# Patient Record
Sex: Female | Born: 1965 | Hispanic: No | Marital: Married | Smoking: Never smoker
Health system: Southern US, Community
[De-identification: ages and names within clinical notes are randomized; demographics above are authoritative.]

## PROBLEM LIST (undated history)

## (undated) DIAGNOSIS — Z973 Presence of spectacles and contact lenses: Secondary | ICD-10-CM

## (undated) DIAGNOSIS — Z87898 Personal history of other specified conditions: Secondary | ICD-10-CM

## (undated) DIAGNOSIS — Z860100 Personal history of colon polyps, unspecified: Secondary | ICD-10-CM

## (undated) DIAGNOSIS — N879 Dysplasia of cervix uteri, unspecified: Secondary | ICD-10-CM

## (undated) DIAGNOSIS — L709 Acne, unspecified: Secondary | ICD-10-CM

## (undated) DIAGNOSIS — Z862 Personal history of diseases of the blood and blood-forming organs and certain disorders involving the immune mechanism: Secondary | ICD-10-CM

## (undated) DIAGNOSIS — F32A Depression, unspecified: Secondary | ICD-10-CM

## (undated) DIAGNOSIS — K5790 Diverticulosis of intestine, part unspecified, without perforation or abscess without bleeding: Secondary | ICD-10-CM

## (undated) DIAGNOSIS — E039 Hypothyroidism, unspecified: Secondary | ICD-10-CM

## (undated) DIAGNOSIS — R000 Tachycardia, unspecified: Secondary | ICD-10-CM

## (undated) DIAGNOSIS — R112 Nausea with vomiting, unspecified: Secondary | ICD-10-CM

## (undated) DIAGNOSIS — M79603 Pain in arm, unspecified: Secondary | ICD-10-CM

## (undated) DIAGNOSIS — I10 Essential (primary) hypertension: Secondary | ICD-10-CM

## (undated) DIAGNOSIS — Z8619 Personal history of other infectious and parasitic diseases: Secondary | ICD-10-CM

## (undated) DIAGNOSIS — R002 Palpitations: Secondary | ICD-10-CM

## (undated) DIAGNOSIS — K219 Gastro-esophageal reflux disease without esophagitis: Secondary | ICD-10-CM

## (undated) DIAGNOSIS — H269 Unspecified cataract: Secondary | ICD-10-CM

## (undated) DIAGNOSIS — Z8719 Personal history of other diseases of the digestive system: Secondary | ICD-10-CM

## (undated) DIAGNOSIS — F329 Major depressive disorder, single episode, unspecified: Secondary | ICD-10-CM

## (undated) DIAGNOSIS — E079 Disorder of thyroid, unspecified: Secondary | ICD-10-CM

## (undated) DIAGNOSIS — I959 Hypotension, unspecified: Secondary | ICD-10-CM

## (undated) DIAGNOSIS — Z9889 Other specified postprocedural states: Secondary | ICD-10-CM

## (undated) DIAGNOSIS — N3010 Interstitial cystitis (chronic) without hematuria: Secondary | ICD-10-CM

## (undated) DIAGNOSIS — K648 Other hemorrhoids: Secondary | ICD-10-CM

## (undated) DIAGNOSIS — R0989 Other specified symptoms and signs involving the circulatory and respiratory systems: Secondary | ICD-10-CM

## (undated) DIAGNOSIS — K6282 Dysplasia of anus: Secondary | ICD-10-CM

## (undated) HISTORY — DX: Acne, unspecified: L70.9

## (undated) HISTORY — DX: Depression, unspecified: F32.A

## (undated) HISTORY — PX: TONSILLECTOMY: SUR1361

## (undated) HISTORY — PX: SHOULDER SURGERY: SHX246

## (undated) HISTORY — DX: Unspecified cataract: H26.9

## (undated) HISTORY — DX: Hypothyroidism, unspecified: E03.9

## (undated) HISTORY — PX: BREAST ENHANCEMENT SURGERY: SHX7

## (undated) HISTORY — PX: GYNECOLOGIC CRYOSURGERY: SHX857

## (undated) HISTORY — DX: Major depressive disorder, single episode, unspecified: F32.9

## (undated) HISTORY — PX: VAGINAL WOUND CLOSURE / REPAIR: SUR258

## (undated) HISTORY — PX: CHOLECYSTECTOMY: SHX55

## (undated) HISTORY — DX: Disorder of thyroid, unspecified: E07.9

## (undated) HISTORY — PX: WISDOM TOOTH EXTRACTION: SHX21

## (undated) HISTORY — DX: Essential (primary) hypertension: I10

## (undated) HISTORY — PX: CYSTOSCOPY W/ DILATION OF BLADDER: SUR374

## (undated) HISTORY — PX: CERVICAL CONIZATION W/BX: SHX1330

## (undated) HISTORY — DX: Personal history of other specified conditions: Z87.898

## (undated) HISTORY — PX: DILATION AND EVACUATION: SHX1459

## (undated) HISTORY — PX: TONSILECTOMY, ADENOIDECTOMY, BILATERAL MYRINGOTOMY AND TUBES: SHX2538

---

## 1992-05-05 HISTORY — PX: APPENDECTOMY: SHX54

## 1997-07-28 ENCOUNTER — Ambulatory Visit (HOSPITAL_COMMUNITY): Admission: RE | Admit: 1997-07-28 | Discharge: 1997-07-28 | Payer: Self-pay | Admitting: Obstetrics and Gynecology

## 1997-08-03 ENCOUNTER — Other Ambulatory Visit: Admission: RE | Admit: 1997-08-03 | Discharge: 1997-08-03 | Payer: Self-pay | Admitting: Obstetrics and Gynecology

## 1997-08-11 ENCOUNTER — Other Ambulatory Visit: Admission: RE | Admit: 1997-08-11 | Discharge: 1997-08-11 | Payer: Self-pay | Admitting: Obstetrics and Gynecology

## 1997-08-18 ENCOUNTER — Other Ambulatory Visit: Admission: RE | Admit: 1997-08-18 | Discharge: 1997-08-18 | Payer: Self-pay | Admitting: Obstetrics and Gynecology

## 1997-08-25 ENCOUNTER — Other Ambulatory Visit: Admission: RE | Admit: 1997-08-25 | Discharge: 1997-08-25 | Payer: Self-pay | Admitting: Obstetrics and Gynecology

## 1998-04-20 ENCOUNTER — Other Ambulatory Visit: Admission: RE | Admit: 1998-04-20 | Discharge: 1998-04-20 | Payer: Self-pay | Admitting: Obstetrics and Gynecology

## 1998-08-08 ENCOUNTER — Ambulatory Visit (HOSPITAL_COMMUNITY): Admission: RE | Admit: 1998-08-08 | Discharge: 1998-08-08 | Payer: Self-pay | Admitting: Obstetrics and Gynecology

## 1998-09-10 ENCOUNTER — Encounter: Payer: Self-pay | Admitting: Obstetrics and Gynecology

## 1998-09-10 ENCOUNTER — Ambulatory Visit (HOSPITAL_COMMUNITY): Admission: RE | Admit: 1998-09-10 | Discharge: 1998-09-10 | Payer: Self-pay | Admitting: Obstetrics and Gynecology

## 1998-11-23 ENCOUNTER — Other Ambulatory Visit: Admission: RE | Admit: 1998-11-23 | Discharge: 1998-11-23 | Payer: Self-pay | Admitting: Obstetrics and Gynecology

## 1999-06-29 ENCOUNTER — Inpatient Hospital Stay (HOSPITAL_COMMUNITY): Admission: AD | Admit: 1999-06-29 | Discharge: 1999-07-01 | Payer: Self-pay | Admitting: Obstetrics and Gynecology

## 1999-08-05 ENCOUNTER — Other Ambulatory Visit: Admission: RE | Admit: 1999-08-05 | Discharge: 1999-08-05 | Payer: Self-pay | Admitting: Obstetrics and Gynecology

## 2000-07-17 ENCOUNTER — Other Ambulatory Visit: Admission: RE | Admit: 2000-07-17 | Discharge: 2000-07-17 | Payer: Self-pay | Admitting: Obstetrics and Gynecology

## 2000-07-21 ENCOUNTER — Observation Stay (HOSPITAL_COMMUNITY): Admission: AD | Admit: 2000-07-21 | Discharge: 2000-07-21 | Payer: Self-pay | Admitting: Obstetrics and Gynecology

## 2000-11-24 ENCOUNTER — Inpatient Hospital Stay (HOSPITAL_COMMUNITY): Admission: AD | Admit: 2000-11-24 | Discharge: 2000-11-24 | Payer: Self-pay | Admitting: Obstetrics and Gynecology

## 2000-12-05 ENCOUNTER — Inpatient Hospital Stay (HOSPITAL_COMMUNITY): Admission: AD | Admit: 2000-12-05 | Discharge: 2000-12-05 | Payer: Self-pay | Admitting: Obstetrics and Gynecology

## 2001-02-12 ENCOUNTER — Inpatient Hospital Stay (HOSPITAL_COMMUNITY): Admission: AD | Admit: 2001-02-12 | Discharge: 2001-02-12 | Payer: Self-pay | Admitting: Obstetrics and Gynecology

## 2001-02-17 ENCOUNTER — Inpatient Hospital Stay (HOSPITAL_COMMUNITY): Admission: AD | Admit: 2001-02-17 | Discharge: 2001-02-19 | Payer: Self-pay | Admitting: Obstetrics and Gynecology

## 2001-03-25 ENCOUNTER — Other Ambulatory Visit: Admission: RE | Admit: 2001-03-25 | Discharge: 2001-03-25 | Payer: Self-pay | Admitting: Obstetrics and Gynecology

## 2001-09-01 ENCOUNTER — Encounter: Payer: Self-pay | Admitting: Family Medicine

## 2001-09-01 ENCOUNTER — Encounter: Admission: RE | Admit: 2001-09-01 | Discharge: 2001-09-01 | Payer: Self-pay | Admitting: Family Medicine

## 2002-03-28 ENCOUNTER — Other Ambulatory Visit: Admission: RE | Admit: 2002-03-28 | Discharge: 2002-03-28 | Payer: Self-pay | Admitting: Obstetrics and Gynecology

## 2002-04-08 ENCOUNTER — Encounter: Admission: RE | Admit: 2002-04-08 | Discharge: 2002-04-08 | Payer: Self-pay | Admitting: Family Medicine

## 2002-04-08 ENCOUNTER — Encounter: Payer: Self-pay | Admitting: Family Medicine

## 2002-05-03 ENCOUNTER — Encounter: Admission: RE | Admit: 2002-05-03 | Discharge: 2002-05-03 | Payer: Self-pay | Admitting: Family Medicine

## 2002-05-03 ENCOUNTER — Encounter: Payer: Self-pay | Admitting: Family Medicine

## 2003-04-07 ENCOUNTER — Encounter: Admission: RE | Admit: 2003-04-07 | Discharge: 2003-04-07 | Payer: Self-pay | Admitting: Gastroenterology

## 2003-05-04 ENCOUNTER — Other Ambulatory Visit: Admission: RE | Admit: 2003-05-04 | Discharge: 2003-05-04 | Payer: Self-pay | Admitting: Obstetrics and Gynecology

## 2005-03-05 HISTORY — PX: TRANSTHORACIC ECHOCARDIOGRAM: SHX275

## 2005-03-07 ENCOUNTER — Ambulatory Visit: Payer: Self-pay | Admitting: Cardiovascular Disease

## 2005-03-20 ENCOUNTER — Encounter: Payer: Self-pay | Admitting: Cardiology

## 2005-03-20 ENCOUNTER — Ambulatory Visit: Payer: Self-pay

## 2006-10-29 ENCOUNTER — Encounter
Admission: RE | Admit: 2006-10-29 | Discharge: 2006-10-29 | Payer: PRIVATE HEALTH INSURANCE | Admitting: Obstetrics and Gynecology

## 2009-01-10 ENCOUNTER — Emergency Department (HOSPITAL_BASED_OUTPATIENT_CLINIC_OR_DEPARTMENT_OTHER): Admission: EM | Admit: 2009-01-10 | Discharge: 2009-01-10 | Payer: Self-pay | Admitting: Emergency Medicine

## 2009-01-10 ENCOUNTER — Ambulatory Visit: Payer: Self-pay | Admitting: Diagnostic Radiology

## 2010-05-26 ENCOUNTER — Encounter: Payer: Self-pay | Admitting: Internal Medicine

## 2010-08-09 LAB — HEPATIC FUNCTION PANEL
ALT: 30 U/L (ref 0–35)
AST: 28 U/L (ref 0–37)
Albumin: 4 g/dL (ref 3.5–5.2)
Alkaline Phosphatase: 77 U/L (ref 39–117)
Bilirubin, Direct: 0 mg/dL (ref 0.0–0.3)
Indirect Bilirubin: 0.8 mg/dL (ref 0.3–0.9)
Total Bilirubin: 0.8 mg/dL (ref 0.3–1.2)
Total Protein: 7.2 g/dL (ref 6.0–8.3)

## 2010-08-09 LAB — URINE MICROSCOPIC-ADD ON

## 2010-08-09 LAB — CBC
HCT: 39.9 % (ref 36.0–46.0)
Hemoglobin: 14.2 g/dL (ref 12.0–15.0)
MCHC: 35.5 g/dL (ref 30.0–36.0)
MCV: 90.4 fL (ref 78.0–100.0)
Platelets: 362 10*3/uL (ref 150–400)
RBC: 4.42 MIL/uL (ref 3.87–5.11)
RDW: 11.4 % — ABNORMAL LOW (ref 11.5–15.5)
WBC: 7.1 10*3/uL (ref 4.0–10.5)

## 2010-08-09 LAB — DIFFERENTIAL
Basophils Absolute: 0.1 10*3/uL (ref 0.0–0.1)
Basophils Relative: 1 % (ref 0–1)
Eosinophils Absolute: 0.1 10*3/uL (ref 0.0–0.7)
Eosinophils Relative: 1 % (ref 0–5)
Lymphocytes Relative: 29 % (ref 12–46)
Lymphs Abs: 2.1 10*3/uL (ref 0.7–4.0)
Monocytes Absolute: 0.7 10*3/uL (ref 0.1–1.0)
Monocytes Relative: 9 % (ref 3–12)
Neutro Abs: 4.1 10*3/uL (ref 1.7–7.7)
Neutrophils Relative %: 59 % (ref 43–77)

## 2010-08-09 LAB — URINALYSIS, ROUTINE W REFLEX MICROSCOPIC
Bilirubin Urine: NEGATIVE
Glucose, UA: NEGATIVE mg/dL
Hgb urine dipstick: NEGATIVE
Ketones, ur: NEGATIVE mg/dL
Nitrite: NEGATIVE
Protein, ur: NEGATIVE mg/dL
Specific Gravity, Urine: 1.019 (ref 1.005–1.030)
Urobilinogen, UA: 0.2 mg/dL (ref 0.0–1.0)
pH: 8.5 — ABNORMAL HIGH (ref 5.0–8.0)

## 2010-08-09 LAB — BASIC METABOLIC PANEL
BUN: 13 mg/dL (ref 6–23)
CO2: 33 mEq/L — ABNORMAL HIGH (ref 19–32)
Calcium: 10.4 mg/dL (ref 8.4–10.5)
Chloride: 100 mEq/L (ref 96–112)
Creatinine, Ser: 0.6 mg/dL (ref 0.4–1.2)
GFR calc Af Amer: >60 mL/min (ref >60)
GFR calc non Af Amer: >60 mL/min (ref >60)
Glucose, Bld: 87 mg/dL (ref 70–99)
Potassium: 3.5 mEq/L (ref 3.5–5.1)
Sodium: 139 mEq/L (ref 135–145)

## 2010-08-09 LAB — POCT CARDIAC MARKERS
CKMB, poc: <1 ng/mL — ABNORMAL LOW (ref 1.0–8.0)
Myoglobin, poc: 35.4 ng/mL (ref 12–200)
Troponin i, poc: <0.05 ng/mL (ref 0.00–0.09)

## 2010-08-09 LAB — LIPASE, BLOOD: Lipase: 99 U/L (ref 23–300)

## 2010-08-09 LAB — D-DIMER, QUANTITATIVE (NOT AT ARMC): D-Dimer, Quant: <0.22 ug/mL-FEU (ref 0.00–0.48)

## 2010-09-20 NOTE — Op Note (Signed)
[facility]  Patient:    [name], Kimberly Macdonald                    MRN: [id number] Proc. Date: 06/29/99 Adm. Date:  [id number] Attending:  Ardeen Fillers CC:         [facility]                           Operative Report  DELIVERY NOTE  PROCEDURE:                    Outlet vacuum-assisted vaginal delivery.  SURGEON:                      Lenoard Aden, M.D.  ANESTHESIA:                   Epidural.  ESTIMATED BLOOD LOSS:         500 cc.  COMPLICATIONS:                None.  INDICATIONS:                  Maternal exhaustion with request for assistance.  DESCRIPTION OF PROCEDURE:     After being apprised of the risks of operative vaginal delivery, vacuum-assisted delivery, injuries to the infant and maternal  injuries, the patient is prepped and draped.  Bladder catheterized until empty.  After this Mityvac M cup is placed at +4 station from direct OA position for traction x 3 pulls for delivery of a viable female over a second degree episiotomy with third degree extension.  Apgars 8 and 9.  Bulb suction on the perineum. Placenta delivered spontaneously intact.  Three-vessel cord noted.  No cervical or vaginal lacerations noted.  Repair with 0 Monocryl and 0 Vicryl.  EBL about 500 cc. No complications. The patient tolerated the procedure well and is recovering in  good condition. DD:  06/29/99 TD:  06/30/99 Job: [id number]

## 2010-09-20 NOTE — H&P (Signed)
[facility]  Patient:    Kimberly Macdonald, Kimberly Macdonald Visit Number: [id number] MRN: [id number]          Service Type: OBS Location: 910B 9159 01 Attending Physician:  Lenoard Aden Dictated by:   Lenoard Aden, M.D. Admit Date:  02/17/2001   CC:         [facility]   History and Physical  CHIEF COMPLAINT:              Gestational hypertension.  HISTORY OF PRESENT ILLNESS:   The patient is a 45 year old white female, G6, P1, at 37-2/7 weeks with induction for new-onset gestational hypertension with stable laboratory values.  ALLERGIES:                    FLAGYL AND CODEINE.  MEDICATIONS:                  Prenatal vitamins.  OBSTETRICAL HISTORY:          TAB in 1988.  SAB in 1998, 1999, and April 2000. A 7-pound 9-ounce female born in February 2001, without complications. Pregnancy complicated, however, by gestation hypertension, history of postpartum depression.  Placed on Prozac and then Effexor.  FAMILY HISTORY:               Hypertension.  Myocardial infarction.  Heart disease.  Emphysema.  Cervical cancer.  PAST SURGICAL HISTORY:        1. Dilatation for bladder distention treatment                                  for interstitial cystitis.                               2. Shoulder surgery.                               3. Appendectomy.                               4. Tonsillectomy.  PRENATAL LABORATORY DATA:     Blood type B positive, Rh antibody negative. Rubella immune.  Hepatitis B surface antigen negative.  HIV nonreactive. Group B strep performed and was positive.  PHYSICAL EXAMINATION:  GENERAL:                      Well-developed, well-nourished white female in no apparent distress.  HEENT:                        Normal.  LUNGS:                        Clear.  HEART:                        Regular rhythm.  ABDOMEN:                      Soft, gravid, nontender.  Estimated fetal weight of 7-1/2 to 8  pounds.  PELVIC:                       Cervix 2-3 cm, 80%,  vertex, -1.  EXTREMITIES:                  DTRs are 2 to 3+.  No evidence of clonus.  No cords.  NEUROLOGIC:                   Nonfocal.  IMPRESSION:                   1. Thirty-seven-week intrauterine pregnancy.                               2. Group B strep positive.                               3. History of preterm labor.  PLAN:                         Proceed with Pitocin induction.  Watch blood pressures closely.  CBC, [facility] laboratories.  Anticipate attempts at vaginal delivery. Dictated by:   Lenoard Aden, M.D. Attending Physician:  Lenoard Aden DD:  02/17/01 TD:  02/17/01 Job: [id number]

## 2010-09-20 NOTE — Consult Note (Signed)
[facility]  Patient:    Kimberly Macdonald, Kimberly Macdonald                    MRN: [id number] Adm. Date:  [id number] Attending:  Silverio Lay A                          Consultation Report  EMERGENCY ROOM VISIT  REASON FOR CONSULT:           Preterm cervical change.  Here for fetal fibronectin test.  HISTORY OF PRESENT ILLNESS:   This is a 45 year old, married, white female, gravida 2, para 1, at 28+ weeks of pregnancy, who was seen in the office this week by Lenoard Aden, M.D.  At that visit, he performed a fetal fibronectin test, but we got a phone call yesterday from [facility] stating that they could not run the test because the sample had been transported in the 56 degrees median.  All proteins were ______ assured.  The patient was called back and asked to come in the emergency room today to redo that fetal fibronectin test.  She denies any complaints, but reports that she has had cervical shortening over ultrasound and that her last measurement was 2.3 cm.  No contraction or preterm labor signs.  Fetal fibronectin was performed and the patient will be called with the results. DD:  12/05/00 TD:  12/05/00 Job: [id number]

## 2010-09-20 NOTE — Consult Note (Signed)
[facility]  Patient:    Kimberly Macdonald, Kimberly Macdonald Visit Number: [id number] MRN: [id number]          Service Type: Attending:  Lenoard Aden, M.D. Dictated by:   Lenoard Aden, M.D. Consultation Date: 02/12/01 Adm. Date:  02/12/01   CC:         [facility]   Consultation Report  EMERGENCY ROOM CONSULTATION  CHIEF COMPLAINT:              Rule out labor.  HISTORY OF PRESENT ILLNESS:   The patient is a 45 year old white female, G6, P1-0-3-1, EDD March 07, 2001, at 36-5/7 weeks, who presents with increasing contractions this evening.  ALLERGIES:                    FLAGYL, IODINE, CODEINE.  OBSTETRICAL HISTORY:          A TAB in 1988.  SAB in 1998.  SAB in 1999.  SAB April 2000.  Spontaneous vaginal delivery of 7-pound, 9-ounce female in February 2001.  PAST MEDICAL HISTORY:         Postpartum depression, previously on Prozac. Currently on Zoloft.  MEDICATIONS:                  Zoloft and prenatal vitamins.  GYNECOLOGICAL HISTORY:        Laser surgery and cryosurgery due to an abnormal Pap smear.  History of interstitial cystitis, status post hydrodistention.  FAMILY HISTORY:               Myasthenia gravis, emphysema, cervical cancer.  PAST SURGICAL HISTORY:        1. Shoulder surgery.                               2. Bladder distention x 2.                               3. Appendectomy.                               4. Tonsillectomy.  PRENATAL LABORATORY DATA:     Blood type B positive, Rh antibody negative, rubella immune.  Hepatitis B surface antigen negative.  HIV nonreactive.  GC and chlamydia negative.  Pregnancy course complicated by preterm cervical change, stabilized on bed rest.  GBS positive.  PHYSICAL EXAMINATION:  GENERAL:                      Well-developed, well-nourished white female in no apparent distress.  HEENT:                        Normal.  LUNGS:                        Clear.  HEART:                         Regular rhythm.  ABDOMEN:                      Soft, gravid, nontender.  No CVA tenderness noted.  PELVIC:  Cervix is 2 cm dilated, 2 cm long, soft, vertex, and -2.  EXTREMITIES:                  No cords.  NEUROLOGIC:                   Nonfocal.  LABORATORY DATA:              Fetal heart rate tracing reveals the fetal heart rate to be in the 140- to 150-beat-per-minute range, and marked accelerations are noted.  Contractions are irregular, approximately every eight minutes.  IMPRESSION:                   1. Prodromal labor pattern without evidence of                                  cervical change.                               2. At 36+ weeks.                               3. History of preterm cervical change, stable                                  at this time.  Group B strep positive.                               4. History of mildly elevated blood pressure on                                  initial admission.  No signs and symptoms                                  consistent with preeclampsia.  PLAN:                         Discharge to home.  Ambien, pack given.  Ambien prescription 10 mg p.o. q.h.s. p.r.n., #10 given.  Follow up in the office in three to four days.  Preeclamptic warnings given. y Dictated by:   Lenoard Aden, M.D. Attending:  Lenoard Aden, M.D. DD:  02/12/01 TD:  02/12/01 Job: [id number]

## 2010-09-20 NOTE — Op Note (Signed)
[facility]  Patient:    Kimberly Macdonald, Kimberly Macdonald Visit Number: [id number] MRN: [id number]          Service Type: OBS Location: 910A 9115 01 Attending Physician:  Lenoard Aden Dictated by:   Lenoard Aden, M.D. Proc. Date: 02/17/01 Admit Date:  02/17/2001                             Operative Report  PREOPERATIVE DIAGNOSES:       Postpartum bleeding, postoperative cervical laceration at 9, 12, and 3 oclock.  PROCEDURE:                    Repair of cervical laceration x 3.  SURGEON:                      Lenoard Aden, M.D.  ASSISTANT:                    Sung Amabile. Roslyn Smiling, M.D.  ANESTHESIA:                   Spinal by Dr. Harvest Forest.  ESTIMATED BLOOD LOSS:         200 cc.  COMPLICATIONS:                None.  DRAINS:                       None.  COUNTS:                       Correct.  DISPOSITION:                  Patient to recovery in good condition.  OPERATIVE NOTE:               After being apprised of the risks of anesthesia, infection, bleeding, and need to urgently proceed with evaluation of postpartum bleeding, patient was brought to the operating room where she was administered a spinal anesthetic without complications and prepped and draped in the usual sterile fashion, catheterized until the bladder was empty. Weighted speculum placed.  After achieving good anesthesia, Deaver retractors placed and visualization of the cervix using sponge forceps to grasp sequentially each area of the cervix small lacerations are noted at 3 and 9 oclock.  These are closed using an interrupted 0 Chromic suture and at 12 oclock there was an area of abrasion and a horizontal tear along the 12 oclock portion of the cervix and this is sutured using a 0 Chromic matris type suture to achieve good hemostasis.  Suction is accomplished.  Good hemostasis is noted.  All instruments are removed and the area is observed. Cervix is reinspected 360 degrees and  found to be hemostatic.  The patient tolerates procedure well and is transferred to recovery in good condition.Dictated by:   Lenoard Aden, M.D. Attending Physician:  Lenoard Aden DD:  02/17/01 TD:  02/18/01 Job: [id number]

## 2010-09-20 NOTE — H&P (Signed)
[facility]  Patient:    Kimberly Macdonald, Kimberly Macdonald                    MRN: [id number] Adm. Date:  [id number] Attending:  Maxie Better                         History and Physical  24 HOUR OBSERVATION  CHIEF COMPLAINT:              Nausea, vomiting, achiness.  HISTORY OF PRESENT ILLNESS:   The patient is a 45 year old, white female, G6, P1-0-4-1, at [redacted] weeks gestation with persistent nausea, vomiting, ketonuria, and low-grade fever.  PAST MEDICAL HISTORY:         Remarkable for interstitial cystitis and IBS. She has had one complicated vaginal delivery and four uncomplicated miscarriages.  ALLERGIES:                    She has allergies to FLAGYL.  MEDICATIONS:                  Include Effexor and prenatal vitamins.  PAST MEDICAL HISTORY:         In addition, her surgical history includes a tonsillectomy and an appendectomy.  PHYSICAL EXAMINATION:  GENERAL:                      On physical examination she is a well-developed, well-nourished, white female, in a moderate amount of stress.  VITAL SIGNS:                  Temperature 98.1, pulse 88, respirations 20, blood pressure 128/90.  LUNGS:                        Clear.  HEART:                        Regular rate and rhythm.  ABDOMEN:                      Soft, nontender.  PELVIC:                       Examination deferred.  EXTREMITIES:                  Reveal no cords.  NEUROLOGICAL:                 Examination is nonfocal.  IMPRESSION:                   Hyperemesis verus flu-like symptoms.  PLAN:                         Proceed with IV hydration, Zofran IV, given prescription for upper respiratory infection for Ceftin 250 b.i.d. x 7 days and Phenergan with Codeine cough syrup.  Followup in the office p.r.n. DD:  07/21/00 TD:  07/21/00 Job: [id number]

## 2011-04-28 ENCOUNTER — Ambulatory Visit (INDEPENDENT_AMBULATORY_CARE_PROVIDER_SITE_OTHER): Payer: PRIVATE HEALTH INSURANCE

## 2011-04-28 DIAGNOSIS — N760 Acute vaginitis: Secondary | ICD-10-CM

## 2011-04-28 DIAGNOSIS — N898 Other specified noninflammatory disorders of vagina: Secondary | ICD-10-CM

## 2011-05-02 ENCOUNTER — Ambulatory Visit (INDEPENDENT_AMBULATORY_CARE_PROVIDER_SITE_OTHER): Payer: PRIVATE HEALTH INSURANCE

## 2011-05-02 DIAGNOSIS — K645 Perianal venous thrombosis: Secondary | ICD-10-CM

## 2011-05-02 DIAGNOSIS — I10 Essential (primary) hypertension: Secondary | ICD-10-CM

## 2011-05-02 DIAGNOSIS — R002 Palpitations: Secondary | ICD-10-CM

## 2011-05-12 ENCOUNTER — Ambulatory Visit (INDEPENDENT_AMBULATORY_CARE_PROVIDER_SITE_OTHER): Payer: Self-pay

## 2011-05-12 DIAGNOSIS — I959 Hypotension, unspecified: Secondary | ICD-10-CM

## 2011-05-12 DIAGNOSIS — R002 Palpitations: Secondary | ICD-10-CM

## 2011-05-17 ENCOUNTER — Encounter: Payer: Self-pay | Admitting: Family Medicine

## 2011-05-17 DIAGNOSIS — R000 Tachycardia, unspecified: Secondary | ICD-10-CM | POA: Insufficient documentation

## 2011-05-17 DIAGNOSIS — I10 Essential (primary) hypertension: Secondary | ICD-10-CM | POA: Insufficient documentation

## 2011-05-17 DIAGNOSIS — F329 Major depressive disorder, single episode, unspecified: Secondary | ICD-10-CM | POA: Insufficient documentation

## 2011-05-17 DIAGNOSIS — F32A Depression, unspecified: Secondary | ICD-10-CM | POA: Insufficient documentation

## 2011-06-09 ENCOUNTER — Encounter: Payer: Self-pay | Admitting: Family Medicine

## 2011-06-09 ENCOUNTER — Ambulatory Visit: Payer: PRIVATE HEALTH INSURANCE | Admitting: Family Medicine

## 2011-06-09 ENCOUNTER — Ambulatory Visit (INDEPENDENT_AMBULATORY_CARE_PROVIDER_SITE_OTHER): Payer: Self-pay | Admitting: Family Medicine

## 2011-06-09 DIAGNOSIS — R002 Palpitations: Secondary | ICD-10-CM

## 2011-06-09 DIAGNOSIS — Z0000 Encounter for general adult medical examination without abnormal findings: Secondary | ICD-10-CM

## 2011-06-09 DIAGNOSIS — R000 Tachycardia, unspecified: Secondary | ICD-10-CM | POA: Insufficient documentation

## 2011-06-09 DIAGNOSIS — R5381 Other malaise: Secondary | ICD-10-CM

## 2011-06-09 DIAGNOSIS — F411 Generalized anxiety disorder: Secondary | ICD-10-CM

## 2011-06-09 DIAGNOSIS — Z23 Encounter for immunization: Secondary | ICD-10-CM

## 2011-06-09 LAB — COMPREHENSIVE METABOLIC PANEL
ALT: 18 U/L (ref 0–35)
AST: 24 U/L (ref 0–37)
Albumin: 4.2 g/dL (ref 3.5–5.2)
Alkaline Phosphatase: 52 U/L (ref 39–117)
BUN: 11 mg/dL (ref 6–23)
CO2: 30 mEq/L (ref 19–32)
Calcium: 9.7 mg/dL (ref 8.4–10.5)
Chloride: 104 mEq/L (ref 96–112)
Creat: 0.68 mg/dL (ref 0.50–1.10)
Glucose, Bld: 95 mg/dL (ref 70–99)
Potassium: 4.4 mEq/L (ref 3.5–5.3)
Sodium: 142 mEq/L (ref 135–145)
Total Bilirubin: 0.4 mg/dL (ref 0.3–1.2)
Total Protein: 6.3 g/dL (ref 6.0–8.3)

## 2011-06-09 LAB — TSH: TSH: 2.261 u[IU]/mL (ref 0.350–4.500)

## 2011-06-09 LAB — LIPID PANEL
Cholesterol: 192 mg/dL (ref 0–200)
HDL: 61 mg/dL (ref >39)
LDL Cholesterol: 91 mg/dL (ref 0–99)
Total CHOL/HDL Ratio: 3.1 Ratio
Triglycerides: 198 mg/dL — ABNORMAL HIGH (ref <150)
VLDL: 40 mg/dL (ref 0–40)

## 2011-06-09 MED ORDER — TRAZODONE HCL 50 MG PO TABS: 50.0000 mg | ORAL_TABLET | Freq: Every evening | ORAL | Status: DC | PRN

## 2011-06-09 NOTE — Progress Notes (Signed)
  Subjective:    Patient ID: Kimberly Macdonald, female    DOB: 05/31/1965, 46 y.o.   MRN: [id number]  HPI Patient presents for CPE. (Please pink intake form-to be scanned)  Palpitations- currently on Metoprolol. Did forget to take dose yesterday. Has seen [place] cardiologists in the past.   Significant stressors, 16 yo daughter with depression and self mutilating behaviors, 20 year old son with ADD, 27 year old daughter with chronic GI issues.   Insomnia- chronic X years; restoril in the past however benefit short lasting.  Interested in trying another agent.  Review of Systems See pink intake form    Objective:   Physical Exam  Nursing note and vitals reviewed. Constitutional: She is oriented to person, place, and time. She appears well-developed and well-nourished.  HENT:  Head: Normocephalic and atraumatic.  Right Ear: External ear normal.  Left Ear: External ear normal.  Nose: Nose normal.  Eyes: Conjunctivae, EOM and lids are normal. Pupils are equal, round, and reactive to light.  Neck: Normal range of motion. Neck supple.  Cardiovascular: Normal rate, regular rhythm, normal heart sounds and intact distal pulses.   Pulmonary/Chest: Effort normal and breath sounds normal.  Abdominal: Soft. Normal appearance and bowel sounds are normal.  Genitourinary: Vagina normal and uterus normal.       No breast lesions or nipple discharge  Musculoskeletal: Normal range of motion.  Lymphadenopathy:    She has no cervical adenopathy.  Neurological: She is alert and oriented to person, place, and time. She has normal strength and normal reflexes. No cranial nerve deficit.  Skin: Skin is warm and dry.  Psychiatric: She has a normal mood and affect. Cognition and memory are normal.      EKG Sinus tachycardia  See PH-9 and MDQ questionnaires  Assessment & Plan:   1. General medical examination    2. Palpitation  CBC with Differential, Comprehensive metabolic panel, Lipid panel,  TSH, EKG 12-Lead  3. Other malaise and fatigue  Vitamin D 1,25 dihydroxy  4. Anxiety state, unspecified  traZODone (DESYREL) 50 MG tablet qhs, patient to schedule apt for counseling and with psychiatry for medication consult. Names provided

## 2011-06-09 NOTE — Patient Instructions (Signed)
Will call once all lab work is back. Resume Toprol. Lynnae Sandhoff or Marissa Calamity for family counseling Dr. Evelene Croon for medication consult

## 2011-06-10 LAB — CBC WITH DIFFERENTIAL/PLATELET
Basophils Absolute: 0 10*3/uL (ref 0.0–0.1)
Basophils Relative: 0 % (ref 0–1)
Eosinophils Absolute: 0.1 10*3/uL (ref 0.0–0.7)
Eosinophils Relative: 1 % (ref 0–5)
HCT: 39.2 % (ref 36.0–46.0)
Hemoglobin: 13.3 g/dL (ref 12.0–15.0)
Lymphocytes Relative: 32 % (ref 12–46)
Lymphs Abs: 1.7 10*3/uL (ref 0.7–4.0)
MCH: 30.6 pg (ref 26.0–34.0)
MCHC: 33.9 g/dL (ref 30.0–36.0)
MCV: 90.3 fL (ref 78.0–100.0)
Monocytes Absolute: 0.5 10*3/uL (ref 0.1–1.0)
Monocytes Relative: 8 % (ref 3–12)
Neutro Abs: 3.2 10*3/uL (ref 1.7–7.7)
Neutrophils Relative %: 59 % (ref 43–77)
Platelets: 291 10*3/uL (ref 150–400)
RBC: 4.34 MIL/uL (ref 3.87–5.11)
RDW: 12.7 % (ref 11.5–15.5)
WBC: 5.5 10*3/uL (ref 4.0–10.5)

## 2011-06-11 NOTE — Progress Notes (Signed)
Quick Note:  Please call patient with normal labs to date. Some still pending.Thanks! KR  ______

## 2011-06-12 LAB — VITAMIN D 1,25 DIHYDROXY
Vitamin D 1, 25 (OH)2 Total: 113 pg/mL — ABNORMAL HIGH (ref 18–72)
Vitamin D2 1, 25 (OH)2: <8 pg/mL
Vitamin D3 1, 25 (OH)2: 113 pg/mL

## 2011-06-25 ENCOUNTER — Other Ambulatory Visit: Payer: Self-pay

## 2011-06-25 MED ORDER — METOPROLOL SUCCINATE ER 25 MG PO TB24: 50.0000 mg | ORAL_TABLET | Freq: Two times a day (BID) | ORAL | Status: DC

## 2011-07-09 ENCOUNTER — Other Ambulatory Visit: Payer: Self-pay | Admitting: Family Medicine

## 2011-07-28 ENCOUNTER — Other Ambulatory Visit: Payer: Self-pay | Admitting: Family Medicine

## 2011-08-16 ENCOUNTER — Other Ambulatory Visit: Payer: Self-pay | Admitting: *Deleted

## 2011-08-16 MED ORDER — TRAZODONE HCL 50 MG PO TABS: 50.0000 mg | ORAL_TABLET | Freq: Every day | ORAL | Status: DC

## 2011-09-10 ENCOUNTER — Ambulatory Visit (INDEPENDENT_AMBULATORY_CARE_PROVIDER_SITE_OTHER): Payer: Self-pay | Admitting: Emergency Medicine

## 2011-09-10 VITALS — BP 128/85 | HR 74 | Temp 98.6°F | Resp 18 | Ht 62.5 in | Wt 141.0 lb

## 2011-09-10 DIAGNOSIS — E669 Obesity, unspecified: Secondary | ICD-10-CM

## 2011-09-10 DIAGNOSIS — I10 Essential (primary) hypertension: Secondary | ICD-10-CM

## 2011-09-10 NOTE — Progress Notes (Signed)
  Subjective:    Patient ID: Kimberly Macdonald, female    DOB: 03/16/1966, 46 y.o.   MRN: [id number]  HPI patient here concerned about her weight gain. She is requesting phentermine to help with weight loss. She has not been involved in an exercise program recently he has not been adherent to her diet. When I reviewed the chart she's had issues with palpitations in the past and is on blood pressure medication    Review of Systems     Objective:   Physical Exam  Eyes: Pupils are equal, round, and reactive to light.  Neck: No tracheal deviation present. No thyromegaly present.  Cardiovascular: Normal rate and regular rhythm.   Pulmonary/Chest: Effort normal and breath sounds normal. No respiratory distress.  Abdominal: There is no tenderness. There is no rebound and no guarding.          Assessment & Plan:    I told her with her history of palpitations and hypertension she is not a candidate to take that medication. She needs to be aggressive about diet weight loss and exercise.

## 2011-10-04 ENCOUNTER — Other Ambulatory Visit: Payer: Self-pay | Admitting: Physician Assistant

## 2011-10-21 ENCOUNTER — Other Ambulatory Visit: Payer: Self-pay | Admitting: Physician Assistant

## 2011-11-22 ENCOUNTER — Other Ambulatory Visit: Payer: Self-pay | Admitting: Physician Assistant

## 2011-12-03 ENCOUNTER — Telehealth: Payer: Self-pay

## 2011-12-03 NOTE — Telephone Encounter (Signed)
Pt is trying to get her medication refilled- metoprolol because from [place] changing her quanity she has ran out  And is going out of town  Please call [facility] Kimberly Macdonald number [phone]  dfb

## 2011-12-04 MED ORDER — METOPROLOL SUCCINATE ER 25 MG PO TB24: 50.0000 mg | ORAL_TABLET | Freq: Two times a day (BID) | ORAL | Status: DC

## 2011-12-04 NOTE — Telephone Encounter (Signed)
Needs office visit pt states she was advised by Dr Hal Hope she needs 2 pills instead of 1 by phone but we have no record of this. Patient is going to be out of town, states need renewal enough to last until she gets back from out of town. She will be gone until Friday next week, can we call in supply to last until then, she indicated she can follow up after she gets back from out of town but will be difficult for her to come in before she goes on her trip.

## 2011-12-04 NOTE — Telephone Encounter (Signed)
Notified pt that enough of her medication was sent in to cover her until she gets back in town. Pt thanked [place] and agreed to RTC before she runs out.

## 2011-12-04 NOTE — Telephone Encounter (Signed)
Advise patient that a 1-week supply was sent in.  We'll want to see her to adjust the medication-it's an extended release, so we should be able to modify it for her to take 1 pill once daily.

## 2011-12-21 ENCOUNTER — Ambulatory Visit (INDEPENDENT_AMBULATORY_CARE_PROVIDER_SITE_OTHER): Payer: Self-pay | Admitting: Emergency Medicine

## 2011-12-21 VITALS — BP 114/78 | HR 119 | Temp 99.0°F | Resp 17 | Ht 62.0 in | Wt 143.0 lb

## 2011-12-21 DIAGNOSIS — S335XXA Sprain of ligaments of lumbar spine, initial encounter: Secondary | ICD-10-CM

## 2011-12-21 DIAGNOSIS — E039 Hypothyroidism, unspecified: Secondary | ICD-10-CM

## 2011-12-21 DIAGNOSIS — R000 Tachycardia, unspecified: Secondary | ICD-10-CM

## 2011-12-21 MED ORDER — CYCLOBENZAPRINE HCL 10 MG PO TABS: 10.0000 mg | ORAL_TABLET | Freq: Three times a day (TID) | ORAL | Status: AC | PRN

## 2011-12-21 MED ORDER — METOPROLOL SUCCINATE ER 100 MG PO TB24: 100.0000 mg | ORAL_TABLET | Freq: Every day | ORAL | Status: DC

## 2011-12-21 MED ORDER — NAPROXEN SODIUM 550 MG PO TABS: 550.0000 mg | ORAL_TABLET | Freq: Two times a day (BID) | ORAL | Status: DC

## 2011-12-21 MED ORDER — HYDROCODONE-ACETAMINOPHEN 5-325 MG PO TABS: 1.0000 | ORAL_TABLET | Freq: Four times a day (QID) | ORAL | Status: AC | PRN

## 2011-12-21 NOTE — Progress Notes (Signed)
  Subjective:    Patient ID: Kimberly Macdonald, female    DOB: [birth year], 46 y.o.   MRN: [id number]  Back Pain This is a new problem. The current episode started yesterday. The problem occurs constantly. The problem is unchanged. The pain is present in the lumbar spine. The quality of the pain is described as aching. The pain radiates to the left foot. The pain is moderate. The symptoms are aggravated by bending, coughing, position and sitting. Associated symptoms include leg pain. Pertinent negatives include no abdominal pain, bladder incontinence, bowel incontinence, chest pain, dysuria, fever, headaches, numbness, paresis, paresthesias, pelvic pain, perianal numbness, tingling, weakness or weight loss. She has tried NSAIDs for the symptoms.  Palpitations  This is a chronic problem. The current episode started more than 1 year ago. The problem has been unchanged. The symptoms are aggravated by exercise. Pertinent negatives include no anxiety, chest fullness, chest pain, coughing, diaphoresis, dizziness, fever, irregular heartbeat, malaise/fatigue, nausea, near-syncope, numbness, shortness of breath, syncope, vomiting or weakness. She has tried beta blockers for the symptoms. There is no history of anemia, anxiety, drug use, heart disease, hyperthyroidism or a valve disorder.      Review of Systems  Constitutional: Negative.  Negative for fever, weight loss, malaise/fatigue and diaphoresis.  Eyes: Negative.   Respiratory: Negative.  Negative for cough and shortness of breath.   Cardiovascular: Positive for palpitations. Negative for chest pain, syncope and near-syncope.  Gastrointestinal: Negative for nausea, vomiting, abdominal pain and bowel incontinence.  Genitourinary: Negative.  Negative for bladder incontinence, dysuria and pelvic pain.  Musculoskeletal: Positive for back pain.  Neurological: Negative for dizziness, tingling, weakness, numbness, headaches and paresthesias.         Objective:   Physical Exam  Constitutional: She is oriented to person, place, and time. She appears well-developed and well-nourished.  HENT:  Head: Normocephalic and atraumatic.  Right Ear: External ear normal.  Left Ear: External ear normal.  Eyes: Conjunctivae are normal. Pupils are equal, round, and reactive to light.  Neck: Normal range of motion. Neck supple. No thyromegaly present.  Cardiovascular: Regular rhythm.  Tachycardia present.   Pulmonary/Chest: Effort normal and breath sounds normal.  Abdominal: Soft.  Musculoskeletal: She exhibits tenderness (left sacral ).  Neurological: She is alert and oriented to person, place, and time.  Skin: Skin is warm and dry.          Assessment & Plan:  Tachycardia  Plan cardiology consult Hypothyroid  TSH Low back pain Anaprox Flexeril vicodin  Follow up as needed

## 2011-12-21 NOTE — Patient Instructions (Signed)
Back Pain, Adult Low back pain is very common. About 1 in 5 people have back pain.The cause of low back pain is rarely dangerous. The pain often gets better over time.About half of people with a sudden onset of back pain feel better in just 2 weeks. About 8 in 10 people feel better by 6 weeks.  CAUSES Some common causes of back pain include:  Strain of the muscles or ligaments supporting the spine.   Wear and tear (degeneration) of the spinal discs.   Arthritis.   Direct injury to the back.  DIAGNOSIS Most of the time, the direct cause of low back pain is not known.However, back pain can be treated effectively even when the exact cause of the pain is unknown.Answering your caregiver's questions about your overall health and symptoms is one of the most accurate ways to make sure the cause of your pain is not dangerous. If your caregiver needs more information, he or she may order lab work or imaging tests (X-rays or MRIs).However, even if imaging tests show changes in your back, this usually does not require surgery. HOME CARE INSTRUCTIONS For many people, back pain returns.Since low back pain is rarely dangerous, it is often a condition that people can learn to manageon their own.   Remain active. It is stressful on the back to sit or stand in one place. Do not sit, drive, or stand in one place for more than 30 minutes at a time. Take short walks on level surfaces as soon as pain allows.Try to increase the length of time you walk each day.   Do not stay in bed.Resting more than 1 or 2 days can delay your recovery.   Do not avoid exercise or work.Your body is made to move.It is not dangerous to be active, even though your back may hurt.Your back will likely heal faster if you return to being active before your pain is gone.   Pay attention to your body when you bend and lift. Many people have less discomfortwhen lifting if they bend their knees, keep the load close to their  bodies,and avoid twisting. Often, the most comfortable positions are those that put less stress on your recovering back.   Find a comfortable position to sleep. Use a firm mattress and lie on your side with your knees slightly bent. If you lie on your back, put a pillow under your knees.   Only take over-the-counter or prescription medicines as directed by your caregiver. Over-the-counter medicines to reduce pain and inflammation are often the most helpful.Your caregiver may prescribe muscle relaxant drugs.These medicines help dull your pain so you can more quickly return to your normal activities and healthy exercise.   Put ice on the injured area.   Put ice in a plastic bag.   Place a towel between your skin and the bag.   Leave the ice on for 15 to 20 minutes, 3 to 4 times a day for the first 2 to 3 days. After that, ice and heat may be alternated to reduce pain and spasms.   Ask your caregiver about trying back exercises and gentle massage. This may be of some benefit.   Avoid feeling anxious or stressed.Stress increases muscle tension and can worsen back pain.It is important to recognize when you are anxious or stressed and learn ways to manage it.Exercise is a great option.  SEEK MEDICAL CARE IF:  You have pain that is not relieved with rest or medicine.   You have   pain that does not improve in 1 week.   You have new symptoms.   You are generally not feeling well.  SEEK IMMEDIATE MEDICAL CARE IF:   You have pain that radiates from your back into your legs.   You develop new bowel or bladder control problems.   You have unusual weakness or numbness in your arms or legs.   You develop nausea or vomiting.   You develop abdominal pain.   You feel faint.  Document Released: 04/21/2005 Document Revised: 04/10/2011 Document Reviewed: 09/09/2010 [employer] Patient Information 2012 [employer]. 

## 2011-12-22 LAB — TSH: TSH: 2.361 u[IU]/mL (ref 0.350–4.500)

## 2011-12-30 ENCOUNTER — Telehealth: Payer: Self-pay

## 2011-12-30 DIAGNOSIS — R000 Tachycardia, unspecified: Secondary | ICD-10-CM

## 2011-12-30 NOTE — Telephone Encounter (Signed)
PATIENT WANTS A CALL BACK IN REGARDS TO THE REFERRAL SHE HAD AND TO FIND OUT HER RESULTS.  IS WANTING TO KNOW IF SHE COULD SEE A DIFFERENT DOCTOR FOR A REFERRAL.  HER BACK IS NOT ANY BETTER EITHER.  PLEASE CALL BACK AT [phone]

## 2011-12-30 NOTE — Telephone Encounter (Signed)
I have called patient and she needs referral changed, she has been sent to [facility], but she wants changed to [facility] instead this is where her husband goes for cardiac issues.

## 2012-02-03 HISTORY — PX: OTHER SURGICAL HISTORY: SHX169

## 2012-02-05 ENCOUNTER — Other Ambulatory Visit: Payer: Self-pay | Admitting: Physician Assistant

## 2012-02-13 ENCOUNTER — Encounter: Payer: Self-pay | Admitting: Family Medicine

## 2012-05-03 ENCOUNTER — Other Ambulatory Visit: Payer: Self-pay | Admitting: Physician Assistant

## 2012-07-05 ENCOUNTER — Other Ambulatory Visit: Payer: Self-pay | Admitting: Physician Assistant

## 2012-09-03 ENCOUNTER — Telehealth: Payer: Self-pay

## 2012-09-03 MED ORDER — SYNTHROID 50 MCG PO TABS: ORAL_TABLET | ORAL | Status: DC

## 2012-09-03 NOTE — Telephone Encounter (Signed)
Pt states that she is aware that she needs a OV in order to receive a refill on synthroid, but she is down to one pill and she is about to go out of town and wont be able to have an OV until Tuesday. Best# [phone] [facility] [place] ([address])

## 2012-09-22 ENCOUNTER — Ambulatory Visit (INDEPENDENT_AMBULATORY_CARE_PROVIDER_SITE_OTHER): Payer: Self-pay | Admitting: Emergency Medicine

## 2012-09-22 VITALS — BP 122/70 | HR 101 | Temp 98.2°F | Resp 16 | Ht 62.0 in | Wt 132.8 lb

## 2012-09-22 DIAGNOSIS — E039 Hypothyroidism, unspecified: Secondary | ICD-10-CM

## 2012-09-22 DIAGNOSIS — L659 Nonscarring hair loss, unspecified: Secondary | ICD-10-CM

## 2012-09-22 DIAGNOSIS — R109 Unspecified abdominal pain: Secondary | ICD-10-CM

## 2012-09-22 LAB — POCT UA - MICROSCOPIC ONLY
Amorphous: POSITIVE
Casts, Ur, LPF, POC: NEGATIVE
Crystals, Ur, HPF, POC: NEGATIVE
Mucus, UA: NEGATIVE
RBC, urine, microscopic: NEGATIVE
WBC, Ur, HPF, POC: NEGATIVE
Yeast, UA: NEGATIVE

## 2012-09-22 LAB — POCT CBC
Granulocyte percent: 61.4 %G (ref 37–80)
HCT, POC: 42.9 % (ref 37.7–47.9)
Hemoglobin: 13.9 g/dL (ref 12.2–16.2)
Lymph, poc: 2 (ref 0.6–3.4)
MCH, POC: 30.2 pg (ref 27–31.2)
MCHC: 32.4 g/dL (ref 31.8–35.4)
MCV: 93.3 fL (ref 80–97)
MID (cbc): 0.6 (ref 0–0.9)
MPV: 7.6 fL (ref 0–99.8)
POC Granulocyte: 4.1 (ref 2–6.9)
POC LYMPH PERCENT: 30.1 %L (ref 10–50)
POC MID %: 8.5 %M (ref 0–12)
Platelet Count, POC: 375 10*3/uL (ref 142–424)
RBC: 4.6 M/uL (ref 4.04–5.48)
RDW, POC: 13 %
WBC: 6.7 10*3/uL (ref 4.6–10.2)

## 2012-09-22 LAB — COMPREHENSIVE METABOLIC PANEL
ALT: 14 U/L (ref 0–35)
AST: 18 U/L (ref 0–37)
Albumin: 4.4 g/dL (ref 3.5–5.2)
Alkaline Phosphatase: 67 U/L (ref 39–117)
BUN: 14 mg/dL (ref 6–23)
CO2: 25 mEq/L (ref 19–32)
Calcium: 9.7 mg/dL (ref 8.4–10.5)
Chloride: 105 mEq/L (ref 96–112)
Creat: 0.76 mg/dL (ref 0.50–1.10)
Glucose, Bld: 77 mg/dL (ref 70–99)
Potassium: 4 mEq/L (ref 3.5–5.3)
Sodium: 139 mEq/L (ref 135–145)
Total Bilirubin: 0.6 mg/dL (ref 0.3–1.2)
Total Protein: 7.1 g/dL (ref 6.0–8.3)

## 2012-09-22 LAB — POCT URINALYSIS DIPSTICK
Bilirubin, UA: NEGATIVE
Blood, UA: NEGATIVE
Glucose, UA: NEGATIVE
Ketones, UA: NEGATIVE
Leukocytes, UA: NEGATIVE
Nitrite, UA: NEGATIVE
Protein, UA: NEGATIVE
Spec Grav, UA: 1.015
Urobilinogen, UA: 0.2
pH, UA: 8.5

## 2012-09-22 LAB — T4, FREE: Free T4: 1.35 ng/dL (ref 0.80–1.80)

## 2012-09-22 LAB — TSH: TSH: 1.955 u[IU]/mL (ref 0.350–4.500)

## 2012-09-22 MED ORDER — SYNTHROID 50 MCG PO TABS: ORAL_TABLET | ORAL | Status: DC

## 2012-09-22 NOTE — Progress Notes (Signed)
  Subjective:    Patient ID: Kimberly Macdonald, female    DOB: 03/04/1966, 47 y.o.   MRN: [id number]  HPI Patient is here today to get her synthroid refilled.  Patient just ran out yesterday.  Patient also concerned with losing her hair, patient states it started getting really noticeable over the last three months.  Patient noticing gobs of hair on shower floor when she washes it.   Patient also presents with Left sided abd pain, radiating toward pelvic area.   Pain usually has worsening pain in the mornings, denies urinary problems and constipation.  Patient describes it as a toothache in the abd.  Patient denies any worsening symptoms after eating.   Family history of diverticulitis.     Review of Systems     Objective:   Physical Exam        Assessment & Plan:

## 2012-10-11 ENCOUNTER — Ambulatory Visit
Admission: RE | Admit: 2012-10-11 | Discharge: 2012-10-11 | Disposition: A | Discharge status: Home / Self Care | Payer: No Typology Code available for payment source | Source: Ambulatory Visit | Attending: Emergency Medicine | Admitting: Emergency Medicine

## 2012-10-11 DIAGNOSIS — R109 Unspecified abdominal pain: Secondary | ICD-10-CM

## 2012-10-14 ENCOUNTER — Other Ambulatory Visit: Payer: Self-pay | Admitting: Family Medicine

## 2012-10-14 DIAGNOSIS — K8020 Calculus of gallbladder without cholecystitis without obstruction: Secondary | ICD-10-CM

## 2012-10-14 NOTE — Progress Notes (Signed)
Pt seen in office with her son. Is agreeable to referral to [facility] for eval of her gallstones, referral placed. Please close out the [place] results in the lab pool.

## 2012-10-19 ENCOUNTER — Telehealth: Payer: Self-pay

## 2012-10-19 DIAGNOSIS — D1803 Hemangioma of intra-abdominal structures: Secondary | ICD-10-CM

## 2012-10-19 NOTE — Telephone Encounter (Signed)
Pt would like to talk with dr Cleta Alberts about her ultrasound results and mass they found

## 2012-10-20 NOTE — Telephone Encounter (Signed)
Thank you, I called her.  I have put in the order for repeat US she has appt with [facility] [place] tomorrow.

## 2012-10-20 NOTE — Telephone Encounter (Signed)
I called patient and left a detailed message on her answering machine. Be sure a referral has been made [facility] regarding her gallstones. I told the patient that if she still had questions to call back and I would try to answer them. I did tell her we should do another ultrasound of the liver in one year to followup on the hemangioma that was found.

## 2012-10-20 NOTE — Telephone Encounter (Signed)
Please advise. 1. Multiple small gallstones of no more than 1.3 cm in diameter.  No present evidence of acute cholecystitis is seen.  2. Probable 1.2 cm hemangioma in the left lobe of liver.  3. The head and tail of the pancreas are obscured by bowel gas.

## 2012-10-21 ENCOUNTER — Telehealth: Payer: Self-pay

## 2012-10-21 ENCOUNTER — Ambulatory Visit (INDEPENDENT_AMBULATORY_CARE_PROVIDER_SITE_OTHER): Payer: PRIVATE HEALTH INSURANCE | Admitting: General Surgery

## 2012-10-21 ENCOUNTER — Encounter (INDEPENDENT_AMBULATORY_CARE_PROVIDER_SITE_OTHER): Payer: Self-pay | Admitting: General Surgery

## 2012-10-21 ENCOUNTER — Telehealth (INDEPENDENT_AMBULATORY_CARE_PROVIDER_SITE_OTHER): Payer: Self-pay

## 2012-10-21 VITALS — BP 112/82 | HR 88 | Temp 98.4°F | Resp 16 | Ht 62.5 in | Wt 133.4 lb

## 2012-10-21 DIAGNOSIS — K8050 Calculus of bile duct without cholangitis or cholecystitis without obstruction: Secondary | ICD-10-CM

## 2012-10-21 DIAGNOSIS — K8020 Calculus of gallbladder without cholecystitis without obstruction: Secondary | ICD-10-CM

## 2012-10-21 DIAGNOSIS — K8021 Calculus of gallbladder without cholecystitis with obstruction: Secondary | ICD-10-CM

## 2012-10-21 NOTE — Telephone Encounter (Signed)
We have not seen her for her back. She must come in for evaluation. Left message to advise.

## 2012-10-21 NOTE — Telephone Encounter (Signed)
Patient has hurt her back and would a pain medication sent over to her pharmacy. [phone]. Uses [facility] in [place]

## 2012-10-21 NOTE — Progress Notes (Signed)
Patient ID: Kimberly Macdonald, female   DOB: [birth year], 47 y.o.   MRN: [id number]  Chief Complaint  Patient presents with  . Cholelithiasis    new pt    HPI Kimberly Macdonald is a 47 y.o. female.  Patient is a 47 year old female with a history of right upper quadrant pain that radiates to her back. She states that the pain is more consistent in the morning. She does state that prior to relieve her pain to eating. She does concentrate on eating a low fat diet.  HPI  Past Medical History  Diagnosis Date  . Hypertension   . Depression   . Acne   . Thyroid disease     hypothyroidism    Past Surgical History  Procedure Laterality Date  . Appendectomy  1994  . Shoulder surgery    . Tonsilectomy, adenoidectomy, bilateral myringotomy and tubes    . Breast enhancement surgery      Family History  Problem Relation Age of Onset  . Hypertension Mother   . Hyperlipidemia Father   . Depression Sister   . Cancer Maternal Grandmother   . Heart disease Paternal Grandmother   . Heart disease Paternal Grandfather     Social History History  Substance Use Topics  . Smoking status: Never Smoker   . Smokeless tobacco: Not on file  . Alcohol Use: Yes    Allergies  Allergen Reactions  . Azo (Phenazopyridine Hcl) Hives  . Flagyl (Metronidazole) Hives  . Nitrofurantoin Monohyd Macro Rash    Flu like sxs  . Lodine (Etodolac)     "Advil is ok"  . Macrolides And Ketolides     Current Outpatient Prescriptions  Medication Sig Dispense Refill  . b complex vitamins tablet Take 1 tablet by mouth daily.      Marland Kitchen co-enzyme Q-10 30 MG capsule Take 30 mg by mouth 3 (three) times daily.      Marland Kitchen L-Arginine 1000 MG TABS Take 9,000 mg by mouth.      . metoprolol succinate (TOPROL-XL) 100 MG 24 hr tablet Take 1 tablet (100 mg total) by mouth daily.  30 tablet  1  . Multiple Vitamin (MULTIVITAMIN) tablet Take 1 tablet by mouth daily.      . Norgestimate-Ethinyl Estradiol Triphasic (ORTHO TRI-CYCLEN  LO) 0.18/0.215/0.25 MG-25 MCG tablet Take 1 tablet by mouth daily.      Marland Kitchen SYNTHROID 50 MCG tablet take 1 tablet by mouth once daily  30 tablet  11  . valACYclovir (VALTREX) 1000 MG tablet Take 1,000 mg by mouth. Once daily for 5 days       No current facility-administered medications for this visit.    Review of Systems Review of Systems  Constitutional: Negative.   HENT: Negative.   Respiratory: Negative.   Cardiovascular: Negative.   Gastrointestinal: Positive for abdominal pain.  Neurological: Negative.     Blood pressure 112/82, pulse 88, temperature 98.4 F (36.9 C), temperature source Oral, resp. rate 16, height 5' 2.5" (1.588 m), weight 133 lb 6.4 oz (60.51 kg), last menstrual period 09/12/2012.  Physical Exam Physical Exam  Constitutional: She is oriented to person, place, and time. She appears well-developed and well-nourished.  HENT:  Head: Normocephalic and atraumatic.  Eyes: Conjunctivae and EOM are normal. Pupils are equal, round, and reactive to light.  Neck: Normal range of motion. Neck supple.  Cardiovascular: Normal rate, regular rhythm and normal heart sounds.   Pulmonary/Chest: Effort normal and breath sounds normal.  Abdominal: Soft. Bowel sounds  are normal. She exhibits no mass. There is tenderness (RUQ). There is no rebound and no guarding.  Musculoskeletal: Normal range of motion.  Neurological: She is alert and oriented to person, place, and time.    Data Reviewed Ultrasound reveals multiple gallstones in the gallbladder. LFTs within normal limits.  Assessment    Patient is a 47 year old female with biliary colic.     Plan    1. We'll proceed to the operating room for laparoscopic cholecystectomy no intraoperative cholangiogram. 2. All risks and benefits were discussed with the patient to generally include: infection, bleeding, possible need for post op ERCP, damage to the bile ducts, and bile leak. Alternatives were offered and described.  All  questions were answered and the patient voiced understanding of the procedure and wishes to proceed at this point with a laparoscopic cholecystectomy         Marigene Ehlers., [facility] 10/21/2012, 10:38 AM

## 2012-10-21 NOTE — Telephone Encounter (Signed)
Pt called stating she forgot to ask Dr Derrell Lolling for pain med to cover her RUQ abd discomfort until surgery 11-03-12. Pt states advil not really helping. No vomiting. No fever. Voiding well and BM normal. Pt advised I will send request to Dr Derrell Lolling and his assistant to review. Pt request rx be called to [facility] [address] in [place]. Pt aware it may be tomorrow before we can review with MD. Pt states she understands.

## 2012-10-22 NOTE — Telephone Encounter (Signed)
LMOM at 4:23 6/20 to notify patient of pain med being called in...tried the home number but it just rang with no machine to leave a message on

## 2012-10-22 NOTE — Telephone Encounter (Signed)
Called in [place] 5/325 #30          Axel Filler, MD ','<More Detail >>       Axel Filler, MD  Kimberly Macdonald     MRN: [id number] DOB: 1965/07/01     Pt Home: [phone]     Sent: Caleen Essex October 22, 2012 6:31 AM     To: June Leap                          Message    Can you call her in some pain meds?      Thanks   AR                   no [facility] [place] [phone] and spoke with Tammy Sours and pt notified 6/20 @4 :20

## 2012-10-28 ENCOUNTER — Ambulatory Visit (INDEPENDENT_AMBULATORY_CARE_PROVIDER_SITE_OTHER): Payer: Self-pay | Admitting: General Surgery

## 2012-11-03 ENCOUNTER — Other Ambulatory Visit (INDEPENDENT_AMBULATORY_CARE_PROVIDER_SITE_OTHER): Payer: Self-pay | Admitting: General Surgery

## 2012-11-03 DIAGNOSIS — K8010 Calculus of gallbladder with chronic cholecystitis without obstruction: Secondary | ICD-10-CM

## 2012-11-03 DIAGNOSIS — K824 Cholesterolosis of gallbladder: Secondary | ICD-10-CM

## 2013-02-14 ENCOUNTER — Encounter: Payer: Self-pay | Admitting: *Deleted

## 2013-02-15 ENCOUNTER — Ambulatory Visit: Payer: No Typology Code available for payment source | Admitting: Internal Medicine

## 2013-02-15 ENCOUNTER — Encounter: Payer: Self-pay | Admitting: Internal Medicine

## 2013-02-16 ENCOUNTER — Encounter: Payer: Self-pay | Admitting: Internal Medicine

## 2013-02-16 ENCOUNTER — Encounter (HOSPITAL_COMMUNITY): Payer: Self-pay | Admitting: *Deleted

## 2013-02-16 ENCOUNTER — Ambulatory Visit (INDEPENDENT_AMBULATORY_CARE_PROVIDER_SITE_OTHER): Payer: PRIVATE HEALTH INSURANCE | Admitting: Internal Medicine

## 2013-02-16 VITALS — BP 128/80 | HR 86 | Ht 63.0 in | Wt 132.3 lb

## 2013-02-16 DIAGNOSIS — R000 Tachycardia, unspecified: Secondary | ICD-10-CM

## 2013-02-16 DIAGNOSIS — R0789 Other chest pain: Secondary | ICD-10-CM | POA: Insufficient documentation

## 2013-02-16 DIAGNOSIS — I498 Other specified cardiac arrhythmias: Secondary | ICD-10-CM

## 2013-02-16 DIAGNOSIS — R0600 Dyspnea, unspecified: Secondary | ICD-10-CM

## 2013-02-16 DIAGNOSIS — I10 Essential (primary) hypertension: Secondary | ICD-10-CM

## 2013-02-16 DIAGNOSIS — R0609 Other forms of dyspnea: Secondary | ICD-10-CM

## 2013-02-16 DIAGNOSIS — R0989 Other specified symptoms and signs involving the circulatory and respiratory systems: Secondary | ICD-10-CM

## 2013-02-16 NOTE — Progress Notes (Signed)
OFFICE NOTE  Chief Complaint:  Chest heaviness, DOE  Primary Care Physician: Lucilla Edin, MD  HPI:  Kimberly Macdonald  is a pleasant 47 year old female with history of tachy palpitations, as well as low back pain and hypothyroidism. Recently, she has been having increased fatigue, poor sleep sometimes at night, but tachy palpitations and tachycardia. I started her on a beta blocker which helped the symptoms somewhat and I recommended metabolic testing. She underwent a MET test on February 23, 2012, which showed a normal effort with a peak VO2 of 73% predicted. Heart rate, however, was markedly accelerated at 97% predicted and a heart rate on VO2 curve showed a steep incline at anaerobic threshold with some flattening of the VO2 curve consistent with probable small vessel ischemia. While this is considered a low-risk study, it is abnormal and as there is significant tachycardia, I discussed with her that reasonable treatment would be increasing her beta blocker. She does report at nighttime she has some palpitations or flipping of her heart and perhaps she will benefit from more beta blockade. After discussion, I recommended increasing her Toprol XL to 100 mg daily which she takes at night.  She is tolerating this well and does not note any more palpitations. She has been doing more exercise and actually feels better for the most part over the past year.  I have recommended the addition of L-arginine and COQ10 which she is taking.  Despite this, over the past few weeks she has noted a little bit more shortness of breath especially when walking up stairs or going up hills. She has not noted during her regular exercise. This is associated with a little chest heaviness and again has only been present intermittently over the past few weeks. She is concerned because she was feeling so well as to why she is now starting to have these symptoms.  Her chest heaviness is mild, by the way, does not radiate to  the neck or the arm. It would be rated a 2 or 3/10 with regard to pain severity and does improve with rest or avoiding walking up stairs.  PMHx:  Past Medical History  Diagnosis Date  . Hypertension   . Depression   . Acne   . Thyroid disease     hypothyroidism  . History of palpitations   . Hypothyroidism     Past Surgical History  Procedure Laterality Date  . Appendectomy  1994  . Shoulder surgery    . Tonsilectomy, adenoidectomy, bilateral myringotomy and tubes    . Breast enhancement surgery    . Cardiometabolic testing  02/2012    normal effort with peak VO2 of 73% predicted, HR accelerated at 97% predcited, HR on VO2 curve showed steep invline at anaerobic threshold - low risk, abnormal  . Transthoracic echocardiogram  03/2005    EF 55-65%, normal LV systolic function - no LV regional wall motion abnormalities    FAMHx:  Family History  Problem Relation Age of Onset  . Hypertension Mother     diverticulitis  . Hyperlipidemia Father     HTN  . Depression Sister   . Breast cancer Maternal Grandmother   . Heart disease Paternal Grandmother     MI   . Heart disease Paternal Grandfather     MI    SOCHx:   reports that she has never smoked. She has never used smokeless tobacco. She reports that she drinks alcohol. She reports that she does not use illicit  drugs.  ALLERGIES:  Allergies  Allergen Reactions  . Azo [Phenazopyridine Hcl] Hives  . Flagyl [Metronidazole] Hives  . Nitrofurantoin Monohyd Macro Rash    Flu like sxs  . Lodine [Etodolac]     "Advil is ok"  . Macrolides And Ketolides     ROS: A comprehensive review of systems was negative except for: Constitutional: positive for fatigue Respiratory: positive for dyspnea on exertion Cardiovascular: positive for chest pressure/discomfort  HOME MEDS: Current Outpatient Prescriptions  Medication Sig Dispense Refill  . b complex vitamins tablet Take 1 tablet by mouth daily.      Marland Kitchen co-enzyme Q-10 30 MG  capsule Take 300 mg by mouth daily.       Marland Kitchen L-Arginine 1000 MG TABS Take 9,000 mg by mouth.      . metoprolol succinate (TOPROL-XL) 100 MG 24 hr tablet Take 1 tablet (100 mg total) by mouth daily.  30 tablet  1  . Multiple Vitamin (MULTIVITAMIN) tablet Take 1 tablet by mouth daily.      . Norgestimate-Ethinyl Estradiol Triphasic (ORTHO TRI-CYCLEN LO) 0.18/0.215/0.25 MG-25 MCG tablet Take 1 tablet by mouth daily.      Marland Kitchen SYNTHROID 50 MCG tablet take 1 tablet by mouth once daily  30 tablet  11  . valACYclovir (VALTREX) 1000 MG tablet Take 1,000 mg by mouth. Once daily for 5 days when needed       No current facility-administered medications for this visit.    LABS/IMAGING: No results found for this or any previous visit (from the past 48 hour(s)). No results found.  VITALS: BP 128/80  Pulse 86  Ht 5\' 3"  (1.6 m)  Wt 132 lb 4.8 oz (60.011 kg)  BMI 23.44 kg/m2  EXAM: General appearance: alert and no distress Neck: no carotid bruit and no JVD Lungs: clear to auscultation bilaterally Heart: regular rate and rhythm, S1, S2 normal, no murmur, click, rub or gallop Abdomen: soft, non-tender; bowel sounds normal; no masses,  no organomegaly Extremities: extremities normal, atraumatic, no cyanosis or edema Pulses: 2+ and symmetric Skin: Skin color, texture, turgor normal. No rashes or lesions Neurologic: Grossly normal Psych: Pleasant, non-anxious  EKG: Normal sinus rhythm at 86, mild sinus arrhythmia  ASSESSMENT: 1. New-onset chest heaviness/dyspnea and exertion 2. Low-risk metabolic stress test approximately one year ago 3. History of palpitations improved on beta blocker  PLAN: 1.   Kimberly Macdonald is having new chest heaviness and a feeling of shortness of breath especially when walking upstairs. She has done extremely well with a low dose beta blocker, L-arginine and co-Q10. Her med test showed a moderately reduced VO2 without a clear ischemic response. CT would be worthwhile repeating  this study for direct comparison to see if she's had improvement with regards to her VO2 on her current medicines. If that has improved significantly, then her episode currently he is unlikely to be related to a cardiac problem. She does note recently she is perimenopausal and having more heavy periods. I wonder if the anemia could be a factor. I plan to see her back after we repeat her cardiometabolic testing which should occur on her beta blocker. Beta blocker should not be held.  Chrystie Nose, MD, [facility] Attending Cardiologist [facility]  Anjanae Woehrle C 02/16/2013, 1:03 PM

## 2013-02-16 NOTE — Patient Instructions (Signed)
Dr. Rennis Golden would like you to schedule a metabolic stress test. This test is done here in our office on Mondays.  Dr. Rennis Golden would like you to follow up after this test.

## 2013-02-17 ENCOUNTER — Encounter: Payer: Self-pay | Admitting: Internal Medicine

## 2013-02-21 DIAGNOSIS — R0602 Shortness of breath: Secondary | ICD-10-CM

## 2013-03-02 ENCOUNTER — Ambulatory Visit: Payer: PRIVATE HEALTH INSURANCE | Admitting: Internal Medicine

## 2013-03-08 ENCOUNTER — Ambulatory Visit (INDEPENDENT_AMBULATORY_CARE_PROVIDER_SITE_OTHER): Payer: PRIVATE HEALTH INSURANCE | Admitting: Internal Medicine

## 2013-03-08 ENCOUNTER — Encounter: Payer: Self-pay | Admitting: Internal Medicine

## 2013-03-08 VITALS — BP 112/82 | HR 84 | Ht 63.0 in | Wt 135.0 lb

## 2013-03-08 DIAGNOSIS — H8191 Unspecified disorder of vestibular function, right ear: Secondary | ICD-10-CM

## 2013-03-08 DIAGNOSIS — I498 Other specified cardiac arrhythmias: Secondary | ICD-10-CM

## 2013-03-08 DIAGNOSIS — R42 Dizziness and giddiness: Secondary | ICD-10-CM

## 2013-03-08 DIAGNOSIS — R000 Tachycardia, unspecified: Secondary | ICD-10-CM

## 2013-03-08 DIAGNOSIS — R0789 Other chest pain: Secondary | ICD-10-CM

## 2013-03-08 DIAGNOSIS — H8190 Unspecified disorder of vestibular function, unspecified ear: Secondary | ICD-10-CM

## 2013-03-08 DIAGNOSIS — H9190 Unspecified hearing loss, unspecified ear: Secondary | ICD-10-CM

## 2013-03-08 NOTE — Patient Instructions (Addendum)
Your physician recommends that you schedule a follow-up appointment as needed.  You have been referred to Dr. Suszanne Conners for dizziness & hearing loss. Their office should be contacting you.

## 2013-03-08 NOTE — Progress Notes (Signed)
OFFICE NOTE  Chief Complaint:  Chest heaviness, DOE  Primary Care Physician: Lucilla Edin, MD  HPI:  Kimberly Macdonald  is a pleasant 47 year old female with history of tachy palpitations, as well as low back pain and hypothyroidism. Recently, she has been having increased fatigue, poor sleep sometimes at night, but tachy palpitations and tachycardia. I started her on a beta blocker which helped the symptoms somewhat and I recommended metabolic testing. She underwent a MET test on February 23, 2012, which showed a normal effort with a peak VO2 of 73% predicted. Heart rate, however, was markedly accelerated at 97% predicted and a heart rate on VO2 curve showed a steep incline at anaerobic threshold with some flattening of the VO2 curve consistent with probable small vessel ischemia. While this is considered a low-risk study, it is abnormal and as there is significant tachycardia, I discussed with her that reasonable treatment would be increasing her beta blocker. She does report at nighttime she has some palpitations or flipping of her heart and perhaps she will benefit from more beta blockade. After discussion, I recommended increasing her Toprol XL to 100 mg daily which she takes at night.  She is tolerating this well and does not note any more palpitations. She has been doing more exercise and actually feels better for the most part over the past year.  I have recommended the addition of L-arginine and COQ10 which she is taking.  Despite this, over the past few weeks she has noted a little bit more shortness of breath especially when walking up stairs or going up hills. She has not noted during her regular exercise. This is associated with a little chest heaviness and again has only been present intermittently over the past few weeks. She is concerned because she was feeling so well as to why she is now starting to have these symptoms.  Her chest heaviness is mild, by the way, does not radiate to  the neck or the arm. It would be rated a 2 or 3/10 with regard to pain severity and does improve with rest or avoiding walking up stairs.  At her last visit I increased her beta blocker to 50 mg twice daily. Also recommended adding L-arginine and co-Q10 to her regimen. She reports having taken it faithfully and she does think it has helped somewhat with her chest heaviness. She still is getting episodes of dizziness and is struggling with fatigue.  I think that the fatigue may be related to poor sleep. She does go to bed at 10:00 but then does not fall sleep until 12:30 and does not sleep well throughout the night. She previous to taking medications to help with sleep but has not taken any recently. With regards to her shortness of breath she says it somewhat similar to the past. She did have a repeat Cardiolite metabolic test which looks very similar to her prior test except for heart rate was slightly higher. Her peak VO2 has improved slightly to 76% predicted. This is again a low risk study although it showed some mild ischemic dysfunction.  She's also complaining of dizziness and the sensation that her head is pulling to the right. She does have a history of some hearing loss in the past which was diagnosed about 3 years ago.  PMHx:  Past Medical History  Diagnosis Date  . Hypertension   . Depression   . Acne   . Thyroid disease     hypothyroidism  .  History of palpitations   . Hypothyroidism     Past Surgical History  Procedure Laterality Date  . Appendectomy  1994  . Shoulder surgery    . Tonsilectomy, adenoidectomy, bilateral myringotomy and tubes    . Breast enhancement surgery    . Cardiometabolic testing  02/2012    normal effort with peak VO2 of 73% predicted, HR accelerated at 97% predcited, HR on VO2 curve showed steep invline at anaerobic threshold - low risk, abnormal  . Transthoracic echocardiogram  03/2005    EF 55-65%, normal LV systolic function - no LV regional wall  motion abnormalities    FAMHx:  Family History  Problem Relation Age of Onset  . Hypertension Mother     diverticulitis  . Hyperlipidemia Father     HTN  . Depression Sister   . Breast cancer Maternal Grandmother   . Heart disease Paternal Grandmother     MI   . Heart disease Paternal Grandfather     MI    SOCHx:   reports that she has never smoked. She has never used smokeless tobacco. She reports that she drinks alcohol. She reports that she does not use illicit drugs.  ALLERGIES:  Allergies  Allergen Reactions  . Azo [Phenazopyridine Hcl] Hives  . Flagyl [Metronidazole] Hives  . Nitrofurantoin Monohyd Macro Rash    Flu like sxs  . Lodine [Etodolac]     "Advil is ok"  . Macrolides And Ketolides     ROS: A comprehensive review of systems was negative except for: Constitutional: positive for fatigue Ears, nose, mouth, throat, and face: positive for hearing loss Respiratory: positive for dyspnea on exertion Cardiovascular: positive for chest pressure/discomfort Neurological: positive for dizziness  HOME MEDS: Current Outpatient Prescriptions  Medication Sig Dispense Refill  . b complex vitamins tablet Take 1 tablet by mouth daily.      Marland Kitchen co-enzyme Q-10 30 MG capsule Take 300 mg by mouth daily.       Marland Kitchen L-Arginine 1000 MG TABS Take 9,000 mg by mouth.      . metoprolol succinate (TOPROL-XL) 100 MG 24 hr tablet Take 1 tablet (100 mg total) by mouth daily.  30 tablet  1  . Multiple Vitamin (MULTIVITAMIN) tablet Take 1 tablet by mouth daily.      . Norgestimate-Ethinyl Estradiol Triphasic (ORTHO TRI-CYCLEN LO) 0.18/0.215/0.25 MG-25 MCG tablet Take 1 tablet by mouth daily.      Marland Kitchen SYNTHROID 50 MCG tablet take 1 tablet by mouth once daily  30 tablet  11  . valACYclovir (VALTREX) 1000 MG tablet Take 1,000 mg by mouth. Once daily for 5 days when needed       No current facility-administered medications for this visit.    LABS/IMAGING: No results found for this or any  previous visit (from the past 48 hour(s)). No results found.  VITALS: BP 112/82  Pulse 84  Ht 5\' 3"  (1.6 m)  Wt 135 lb (61.236 kg)  BMI 23.92 kg/m2  EXAM: General appearance: alert and no distress Neck: no carotid bruit and no JVD Lungs: clear to auscultation bilaterally Heart: regular rate and rhythm, S1, S2 normal, no murmur, click, rub or gallop Abdomen: soft, non-tender; bowel sounds normal; no masses,  no organomegaly Extremities: extremities normal, atraumatic, no cyanosis or edema Pulses: 2+ and symmetric Skin: Skin color, texture, turgor normal. No rashes or lesions Neurologic: Grossly normal Psych: Pleasant, non-anxious  EKG: deferred  ASSESSMENT: 1. Mildly abnormal metabolic testing, slightly improved with increased dose of beta  blocker and supplements 2. History of palpitations improved on beta blocker 3. Fatigue, possibly due to poor sleep 4. Dizziness and associated hearing loss  PLAN: 1.   Mrs. Arias reports mild if any improvement in her symptoms. Her metabolic testing is similar if not slightly improved from her last study. I do not have a good sense as to what is causing her shortness of breath and fatigue. I think sleep is a big factor in it she should consider something to help her sleep or further workup through her primary doctor. With regards to her dizziness and associated hearing loss. She seems to have a dizziness which pulls her head to one side. She denies any sinus congestion or headache associated with this. She does report that she has some hearing loss and was tested about 3 years ago but not treated. I wonder if there could be a neuro-vestibular problem that is causing her dizziness and possibly associated hearing loss. I will glad refer her to ENT.  Chrystie Nose, MD, [facility] Attending Cardiologist [facility]  Bird Swetz C 03/08/2013, 6:16 PM

## 2013-03-09 ENCOUNTER — Telehealth: Payer: Self-pay | Admitting: Internal Medicine

## 2013-03-09 NOTE — Telephone Encounter (Signed)
Faxed office note and demographics to [facility] requesting appt for her dizziness and hearing loss.  (I was unable to reach the practice by phone due to the long wait time on the phone)

## 2013-03-10 NOTE — Telephone Encounter (Signed)
SW WITH PATIENT TO LET HER KNOW APPT SCHEDULED WITH DR. Annalee Genta ON 03/14/13. PATIENT AGREED TO THE TIME AND DATE.

## 2013-04-15 ENCOUNTER — Other Ambulatory Visit: Payer: Self-pay | Admitting: Internal Medicine

## 2013-05-03 ENCOUNTER — Ambulatory Visit (INDEPENDENT_AMBULATORY_CARE_PROVIDER_SITE_OTHER): Payer: Self-pay | Admitting: Emergency Medicine

## 2013-05-03 VITALS — BP 122/70 | HR 100 | Temp 98.8°F | Resp 16 | Ht 63.0 in | Wt 141.0 lb

## 2013-05-03 DIAGNOSIS — E039 Hypothyroidism, unspecified: Secondary | ICD-10-CM

## 2013-05-03 DIAGNOSIS — R000 Tachycardia, unspecified: Secondary | ICD-10-CM

## 2013-05-03 MED ORDER — SYNTHROID 50 MCG PO TABS: ORAL_TABLET | ORAL | Status: DC

## 2013-05-03 MED ORDER — METOPROLOL SUCCINATE ER 100 MG PO TB24: 100.0000 mg | ORAL_TABLET | Freq: Every day | ORAL | Status: DC

## 2013-05-03 NOTE — Patient Instructions (Signed)

## 2013-05-03 NOTE — Progress Notes (Signed)
[facility] [address], [place] [address] [phone]  Date:  05/03/2013   Name:  Kimberly Macdonald   DOB:  05-Dec-1965   MRN:  [id number]  PCP:  Lucilla Edin, MD    Chief Complaint: rx refills   History of Present Illness:  Kimberly Macdonald is a 47 y.o. very pleasant female patient who presents with the following:  History of tachycardia and acquired hypothyroidism.  Is nearly out of her toprol xl.   Tolerating the dose with no adverse effect of medication other than some fatigue and a referral for dizziness to ENT.  Last labs in May.  Denies other complaint or health concern today.   Patient Active Problem List   Diagnosis Date Noted  . Vestibular dizziness 03/08/2013  . Hearing loss 03/08/2013  . Chest heaviness 02/16/2013  . Sinus tachycardia 06/09/2011  . HTN (hypertension), benign 05/17/2011  . Tachycardia 05/17/2011  . Depression 05/17/2011    Past Medical History  Diagnosis Date  . Hypertension   . Depression   . Acne   . Thyroid disease     hypothyroidism  . History of palpitations   . Hypothyroidism     Past Surgical History  Procedure Laterality Date  . Appendectomy  1994  . Shoulder surgery    . Tonsilectomy, adenoidectomy, bilateral myringotomy and tubes    . Breast enhancement surgery    . Cardiometabolic testing  02/2012    normal effort with peak VO2 of 73% predicted, HR accelerated at 97% predcited, HR on VO2 curve showed steep invline at anaerobic threshold - low risk, abnormal  . Transthoracic echocardiogram  03/2005    EF 55-65%, normal LV systolic function - no LV regional wall motion abnormalities    History  Substance Use Topics  . Smoking status: Never Smoker   . Smokeless tobacco: Never Used  . Alcohol Use: Yes     Comment: occasionally (twice yearly)    Family History  Problem Relation Age of Onset  . Hypertension Mother     diverticulitis  . Hyperlipidemia Father     HTN  . Depression Sister    . Breast cancer Maternal Grandmother   . Heart disease Paternal Grandmother     MI   . Heart disease Paternal Grandfather     MI    Allergies  Allergen Reactions  . Azo [Phenazopyridine Hcl] Hives  . Flagyl [Metronidazole] Hives  . Nitrofurantoin Monohyd Macro Rash    Flu like sxs  . Lodine [Etodolac]     "Advil is ok"  . Macrolides And Ketolides     Medication list has been reviewed and updated.  Current Outpatient Prescriptions on File Prior to Visit  Medication Sig Dispense Refill  . b complex vitamins tablet Take 1 tablet by mouth daily.      Marland Kitchen co-enzyme Q-10 30 MG capsule Take 300 mg by mouth daily.       Marland Kitchen L-Arginine 1000 MG TABS Take 9,000 mg by mouth.      . metoprolol succinate (TOPROL-XL) 100 MG 24 hr tablet Take 1 tablet (100 mg total) by mouth daily.  30 tablet  1  . Multiple Vitamin (MULTIVITAMIN) tablet Take 1 tablet by mouth daily.      . Norgestimate-Ethinyl Estradiol Triphasic (ORTHO TRI-CYCLEN LO) 0.18/0.215/0.25 MG-25 MCG tablet Take 1 tablet by mouth daily.      Marland Kitchen SYNTHROID 50 MCG tablet take 1 tablet by mouth once daily  30 tablet  11  . valACYclovir (VALTREX) 1000 MG tablet Take 1,000 mg by mouth. Once daily for 5 days when needed       No current facility-administered medications on file prior to visit.    Review of Systems:  As per HPI, otherwise negative.    Physical Examination: Filed Vitals:   05/03/13 1538  BP: 122/70  Pulse: 100  Temp: 98.8 F (37.1 C)  Resp: 16   Filed Vitals:   05/03/13 1538  Height: 5\' 3"  (1.6 m)  Weight: 141 lb (63.957 kg)   Body mass index is 24.98 kg/(m^2). Ideal Body Weight: Weight in (lb) to have BMI = 25: 140.8  GEN: WDWN, NAD, Non-toxic, A & O x 3 HEENT: Atraumatic, Normocephalic. Neck supple. No masses, No LAD. Ears and Nose: No external deformity. CV: RRR, No M/G/R. No JVD. No thrill. No extra heart sounds.  RST PULM: CTA B, no wheezes, crackles, rhonchi. No retractions. No resp. distress. No  accessory muscle use. ABD: S, NT, ND, +BS. No rebound. No HSM. EXTR: No c/c/e NEURO Normal gait.  PSYCH: Normally interactive. Conversant. Not depressed or anxious appearing.  Calm demeanor.    Assessment and Plan: Sinus tachycardia Hypothyroid Refills  Signed,  Phillips Odor, MD

## 2013-08-09 ENCOUNTER — Ambulatory Visit: Payer: Self-pay | Admitting: Internal Medicine

## 2013-08-09 VITALS — BP 112/90 | HR 89 | Temp 98.1°F | Resp 18 | Wt 133.0 lb

## 2013-08-09 DIAGNOSIS — M79646 Pain in unspecified finger(s): Secondary | ICD-10-CM

## 2013-08-09 DIAGNOSIS — M79609 Pain in unspecified limb: Secondary | ICD-10-CM

## 2013-08-09 DIAGNOSIS — IMO0002 Reserved for concepts with insufficient information to code with codable children: Secondary | ICD-10-CM

## 2013-08-09 MED ORDER — HYDROCODONE-ACETAMINOPHEN 5-325 MG PO TABS: 1.0000 | ORAL_TABLET | Freq: Four times a day (QID) | ORAL | Status: DC | PRN

## 2013-08-09 MED ORDER — DOXYCYCLINE HYCLATE 100 MG PO TABS: 100.0000 mg | ORAL_TABLET | Freq: Two times a day (BID) | ORAL | Status: DC

## 2013-08-09 NOTE — Progress Notes (Signed)
   Subjective:    Patient ID: Kimberly Macdonald, female    DOB: [birth year], 48 y.o.   MRN: [id number]  HPI  48 y/o female left middle finger x 1 month swollen, painful, intermittent throbbing, had acrylic nails, took them off 1 month ago, she states when the nails were applied there was a bit of pain, the pain sometimes radiated up her left arm.  Has early redness to eponychium and to volar finger pad. Review of Systems     Objective:   Physical Exam  Constitutional: She appears well-developed and well-nourished. No distress.  HENT:  Head: Normocephalic.  Eyes: EOM are normal. Pupils are equal, round, and reactive to light.  Neck: Normal range of motion.  Pulmonary/Chest: Effort normal.  Musculoskeletal: She exhibits edema and tenderness.       Right shoulder: She exhibits tenderness. She exhibits normal range of motion.       Right hand: She exhibits tenderness. She exhibits no bony tenderness. Normal sensation noted. Normal strength noted.  Middle finger at nail red, swollen mildly          Assessment & Plan:  Paronychia/Pain finger Doxycycline/Vicodin

## 2013-08-09 NOTE — Patient Instructions (Signed)
Paronychia Paronychia is an inflammatory reaction involving the folds of the skin surrounding the fingernail. This is commonly caused by an infection in the skin around a nail. The most common cause of paronychia is frequent wetting of the hands (as seen with bartenders, food servers, nurses or others who wet their hands). This makes the skin around the fingernail susceptible to infection by bacteria (germs) or fungus. Other predisposing factors are:  Aggressive manicuring.  Nail biting.  Thumb sucking. The most common cause is a staphylococcal (a type of germ) infection, or a fungal (Candida) infection. When caused by a germ, it usually comes on suddenly with redness, swelling, pus and is often painful. It may get under the nail and form an abscess (collection of pus), or form an abscess around the nail. If the nail itself is infected with a fungus, the treatment is usually prolonged and may require oral medicine for up to one year. Your caregiver will determine the length of time treatment is required. The paronychia caused by bacteria (germs) may largely be avoided by not pulling on hangnails or picking at cuticles. When the infection occurs at the tips of the finger it is called felon. When the cause of paronychia is from the herpes simplex virus (HSV) it is called herpetic whitlow. TREATMENT  When an abscess is present treatment is often incision and drainage. This means that the abscess must be cut open so the pus can get out. When this is done, the following home care instructions should be followed. HOME CARE INSTRUCTIONS   It is important to keep the affected fingers very dry. Rubber or plastic gloves over cotton gloves should be used whenever the hand must be placed in water.  Keep wound clean, dry and dressed as suggested by your caregiver between warm soaks or warm compresses.  Soak in warm water for fifteen to twenty minutes three to four times per day for bacterial infections. Fungal  infections are very difficult to treat, so often require treatment for long periods of time.  For bacterial (germ) infections take antibiotics (medicine which kill germs) as directed and finish the prescription, even if the problem appears to be solved before the medicine is gone.  Only take over-the-counter or prescription medicines for pain, discomfort, or fever as directed by your caregiver. SEEK IMMEDIATE MEDICAL CARE IF:  You have redness, swelling, or increasing pain in the wound.  You notice pus coming from the wound.  You have a fever.  You notice a bad smell coming from the wound or dressing. Document Released: 10/15/2000 Document Revised: 07/14/2011 Document Reviewed: 06/16/2008 [employer] Patient Information 2014 [employer].  

## 2013-08-18 ENCOUNTER — Ambulatory Visit: Payer: PRIVATE HEALTH INSURANCE

## 2013-08-18 ENCOUNTER — Ambulatory Visit (INDEPENDENT_AMBULATORY_CARE_PROVIDER_SITE_OTHER): Payer: Self-pay | Admitting: Family Medicine

## 2013-08-18 VITALS — BP 122/84 | HR 78 | Temp 98.3°F | Resp 16 | Ht 62.0 in | Wt 135.0 lb

## 2013-08-18 DIAGNOSIS — R3915 Urgency of urination: Secondary | ICD-10-CM

## 2013-08-18 DIAGNOSIS — M79609 Pain in unspecified limb: Secondary | ICD-10-CM

## 2013-08-18 DIAGNOSIS — M79646 Pain in unspecified finger(s): Secondary | ICD-10-CM

## 2013-08-18 LAB — POCT URINALYSIS DIPSTICK
Bilirubin, UA: NEGATIVE
Blood, UA: NEGATIVE
Glucose, UA: NEGATIVE
Ketones, UA: NEGATIVE
Leukocytes, UA: NEGATIVE
Nitrite, UA: NEGATIVE
Protein, UA: NEGATIVE
Spec Grav, UA: 1.015
Urobilinogen, UA: 0.2
pH, UA: 7

## 2013-08-18 LAB — POCT UA - MICROSCOPIC ONLY
Casts, Ur, LPF, POC: NEGATIVE
Crystals, Ur, HPF, POC: NEGATIVE
Mucus, UA: NEGATIVE
Yeast, UA: NEGATIVE

## 2013-08-18 LAB — GLUCOSE, POCT (MANUAL RESULT ENTRY): POC Glucose: 75 mg/dl (ref 70–99)

## 2013-08-18 MED ORDER — AMOXICILLIN-POT CLAVULANATE 875-125 MG PO TABS: 1.0000 | ORAL_TABLET | Freq: Two times a day (BID) | ORAL | Status: DC

## 2013-08-18 NOTE — Progress Notes (Signed)
  Subjective: Patient continues to hurt at the PIP of her left middle finger. She had been on once a day Bactrim for her acne prior to being treated with doxycycline for a possible paronychia. She continues to hurt there. It throbs enough that she keeps squeezing it all the time. Her other problem is that she's been having urinary frequency for for 5 days.  Objective: Fingertip appears mildly erythematous. There is no fluctuance. No definite paronychia. The tenderness is primarily in the hand of the finger.   [facility] reading (PRIMARY) by  Dr. Linna Darner Normal finger   Results for orders placed in visit on 08/18/13  POCT URINALYSIS DIPSTICK      Result Value Ref Range   Color, UA yellow     Clarity, UA clear     Glucose, UA neg     Bilirubin, UA neg     Ketones, UA neg     Spec Grav, UA 1.015     Blood, UA neg     pH, UA 7.0     Protein, UA neg     Urobilinogen, UA 0.2     Nitrite, UA neg     Leukocytes, UA Negative    POCT UA - MICROSCOPIC ONLY      Result Value Ref Range   WBC, Ur, HPF, POC 0-1     RBC, urine, microscopic 0-1     Bacteria, U Microscopic trace     Mucus, UA neg     Epithelial cells, urine per micros 0-4     Crystals, Ur, HPF, POC neg     Casts, Ur, LPF, POC neg     Yeast, UA neg    GLUCOSE, POCT (MANUAL RESULT ENTRY)      Result Value Ref Range   POC Glucose 75  70 - 99 mg/dl   Assessment: Left third finger tip pain, etiology undetermined Urinary frequency  Plan:  Wear the fingertip protector to keep from squeezing on your finger too much.  Take the Augmentin one twice daily  Take some over-the-counter Azo. if needed to calm the bladder irritation down.  If the fingertip keeps bothering you call back and I'll make referral to the orthopedic hand specialist

## 2013-08-18 NOTE — Patient Instructions (Signed)
Wear the fingertip protector to keep from squeezing on your finger too much.  Take the Augmentin one twice daily  Take some over-the-counter Azo. if needed to calm the bladder irritation down.  If the fingertip keeps bothering you call back and I'll make referral to the orthopedic hand specialist.

## 2013-08-26 ENCOUNTER — Telehealth: Payer: Self-pay

## 2013-08-26 NOTE — Telephone Encounter (Signed)
LMVM to CB. 

## 2013-08-26 NOTE — Telephone Encounter (Signed)
States finger is still swollen and red and sore.  She is taking the amoxicillin. This is her third antibiotic but pain is so intense that she needs a pain medication for night time. She said that Dr. Elder Cyphers had mentioned that if finger does not respond to the abx the fingernail may have to be cut to drain. Please advise.

## 2013-08-26 NOTE — Telephone Encounter (Signed)
Patient has run out of pain medication.  [facility] [address] in [place]  Finger still infected - still taking antibiotics   pleae calll [phone]

## 2013-08-29 ENCOUNTER — Telehealth: Payer: Self-pay

## 2013-08-29 DIAGNOSIS — M79646 Pain in unspecified finger(s): Secondary | ICD-10-CM

## 2013-08-29 NOTE — Telephone Encounter (Signed)
I sent in some Voltaren for her pain.  She can take additional tylenol but no additional Advil or Aleve.  We will also be making a referral to hand because the pain seems to be lasting a long time.

## 2013-08-29 NOTE — Telephone Encounter (Signed)
Spoke to patient.  Advised they had called in Voltaren for pain and it is ok to take additional tylenol but no advil or aleve.  Also, advised that we would be making a referral to the hand specialist.  She is ok with this.

## 2013-08-29 NOTE — Telephone Encounter (Signed)
Patient originally called on 4/24 needing additional pain medication.  Sent message to Dr. Elder Cyphers but  Patient states he has not responded yet.   Patient's finger is still red and swollen although it is improving.  She said it is hard to sleep at night and would like additional pain med if possible.  Please advise.

## 2013-08-30 NOTE — Telephone Encounter (Signed)
rtc 

## 2013-08-31 NOTE — Telephone Encounter (Signed)
This message was resolved by Windell Hummingbird on 08/29/13.

## 2014-04-03 ENCOUNTER — Ambulatory Visit (INDEPENDENT_AMBULATORY_CARE_PROVIDER_SITE_OTHER): Payer: Self-pay | Admitting: Emergency Medicine

## 2014-04-03 VITALS — BP 124/80 | HR 95 | Temp 98.5°F | Resp 18 | Ht 62.0 in | Wt 133.2 lb

## 2014-04-03 DIAGNOSIS — L659 Nonscarring hair loss, unspecified: Secondary | ICD-10-CM

## 2014-04-03 DIAGNOSIS — G44209 Tension-type headache, unspecified, not intractable: Secondary | ICD-10-CM

## 2014-04-03 DIAGNOSIS — E039 Hypothyroidism, unspecified: Secondary | ICD-10-CM

## 2014-04-03 MED ORDER — BUTALBITAL-APAP-CAFFEINE 50-325-40 MG PO TABS: 1.0000 | ORAL_TABLET | Freq: Four times a day (QID) | ORAL | Status: AC | PRN

## 2014-04-03 NOTE — Patient Instructions (Signed)

## 2014-04-03 NOTE — Progress Notes (Signed)
[facility] [address] [place] [address] [phone]  Date:  04/03/2014   Name:  Kimberly Macdonald   DOB:  Oct 31, 1965   MRN:  [id number]  PCP:  Jenny Reichmann, MD    Chief Complaint: Ear Pain; Neck Pain; Back Pain; Headache; and Alopecia   History of Present Illness:  Kimberly Macdonald is a 48 y.o. very pleasant female patient who presents with the following:  Had itching in her right ear and put her finger nail in to scratch it.  Since she has experienced pain in the right ear. Now has a right sided and occipital headache. Headache not related to vision as she has recently had her eyes checked. No drainage from ear, fever chills, cough or coryza. Concerned about chronic hair loss. Says it is falling out in clumps.  Has not had TSH checked in over a year. Denies other complaint or health concern today.   Patient Active Problem List   Diagnosis Date Noted  . Vestibular dizziness 03/08/2013  . Hearing loss 03/08/2013  . Chest heaviness 02/16/2013  . Sinus tachycardia 06/09/2011  . HTN (hypertension), benign 05/17/2011  . Tachycardia 05/17/2011  . Depression 05/17/2011    Past Medical History  Diagnosis Date  . Hypertension   . Depression   . Acne   . Thyroid disease     hypothyroidism  . History of palpitations   . Hypothyroidism     Past Surgical History  Procedure Laterality Date  . Appendectomy  1994  . Shoulder surgery    . Tonsilectomy, adenoidectomy, bilateral myringotomy and tubes    . Breast enhancement surgery    . Cardiometabolic testing  34/7425    normal effort with peak VO2 of 73% predicted, HR accelerated at 97% predcited, HR on VO2 curve showed steep invline at anaerobic threshold - low risk, abnormal  . Transthoracic echocardiogram  03/2005    EF 55-65%, normal LV systolic function - no LV regional wall motion abnormalities    History  Substance Use Topics  . Smoking status: Never Smoker   . Smokeless tobacco:  Never Used  . Alcohol Use: Yes     Comment: occasionally (twice yearly)    Family History  Problem Relation Age of Onset  . Hypertension Mother     diverticulitis  . Hyperlipidemia Father     HTN  . Depression Sister   . Breast cancer Maternal Grandmother   . Heart disease Paternal Grandmother     MI   . Heart disease Paternal Grandfather     MI    Allergies  Allergen Reactions  . Azo [Phenazopyridine Hcl] Hives  . Flagyl [Metronidazole] Hives  . Nitrofurantoin Monohyd Macro Rash    Flu like sxs  . Lodine [Etodolac]     "Advil is ok"  . Macrolides And Ketolides     Medication list has been reviewed and updated.  Current Outpatient Prescriptions on File Prior to Visit  Medication Sig Dispense Refill  . b complex vitamins tablet Take 1 tablet by mouth daily.    Marland Kitchen co-enzyme Q-10 30 MG capsule Take 300 mg by mouth daily.     Marland Kitchen L-Arginine 1000 MG TABS Take 9,000 mg by mouth.    . metoprolol succinate (TOPROL-XL) 100 MG 24 hr tablet Take 1 tablet (100 mg total) by mouth daily. 30 tablet 12  . Multiple Vitamin (MULTIVITAMIN) tablet Take 1 tablet by mouth daily.    . Norgestimate-Ethinyl Estradiol Triphasic (ORTHO  TRI-CYCLEN LO) 0.18/0.215/0.25 MG-25 MCG tablet Take 1 tablet by mouth daily.    Marland Kitchen SYNTHROID 50 MCG tablet take 1 tablet by mouth once daily 30 tablet 11  . valACYclovir (VALTREX) 1000 MG tablet Take 1,000 mg by mouth. Once daily for 5 days when needed     No current facility-administered medications on file prior to visit.    Review of Systems:  As per HPI, otherwise negative.    Physical Examination: Filed Vitals:   04/03/14 1437  BP: 124/80  Pulse: 95  Temp: 98.5 F (36.9 C)  Resp: 18   Filed Vitals:   04/03/14 1437  Height: 5\' 2"  (1.575 m)  Weight: 133 lb 3.2 oz (60.419 kg)   Body mass index is 24.36 kg/(m^2). Ideal Body Weight: Weight in (lb) to have BMI = 25: 136.4  GEN: WDWN, NAD, Non-toxic, A & O x 3 HEENT: Atraumatic, Normocephalic. Neck  supple. No masses, No LAD. Ears and Nose: No external deformity.  TM and external canal negative CV: RRR, No M/G/R. No JVD. No thrill. No extra heart sounds. PULM: CTA B, no wheezes, crackles, rhonchi. No retractions. No resp. distress. No accessory muscle use. ABD: S, NT, ND, +BS. No rebound. No HSM. EXTR: No c/c/e NEURO Normal gait.  PSYCH: Normally interactive. Conversant. Not depressed or anxious appearing.  Calm demeanor.    Assessment and Plan: Tension headache fioricet Hair loss TSH Dermatologist  Signed,  Ellison Carwin, MD

## 2014-04-04 LAB — TSH: TSH: 3.003 u[IU]/mL (ref 0.350–4.500)

## 2014-04-04 MED ORDER — LEVOTHYROXINE SODIUM 88 MCG PO TABS: 88.0000 ug | ORAL_TABLET | Freq: Every day | ORAL | Status: DC

## 2014-04-04 NOTE — Addendum Note (Signed)
Addended by: Roselee Culver on: 04/04/2014 09:01 AM   Modules accepted: Orders

## 2014-05-16 ENCOUNTER — Other Ambulatory Visit: Payer: Self-pay | Admitting: Emergency Medicine

## 2014-05-19 ENCOUNTER — Telehealth: Payer: Self-pay

## 2014-05-19 DIAGNOSIS — E039 Hypothyroidism, unspecified: Secondary | ICD-10-CM

## 2014-05-19 MED ORDER — SYNTHROID 50 MCG PO TABS: ORAL_TABLET | ORAL | Status: DC

## 2014-05-19 NOTE — Telephone Encounter (Signed)
Done and LM on pt's VM letting her know

## 2014-05-19 NOTE — Telephone Encounter (Signed)
Pt called wanting her prescription for SYNTHROID to be at 28mcg not 39mcg. She would also like a refill.  She said the 77 was giving her headaches. Please advise at [phone]

## 2014-05-19 NOTE — Telephone Encounter (Signed)
If you would make that change, it would be great

## 2014-05-23 ENCOUNTER — Other Ambulatory Visit: Payer: Self-pay | Admitting: Emergency Medicine

## 2014-07-21 ENCOUNTER — Telehealth: Payer: Self-pay | Admitting: Emergency Medicine

## 2014-07-21 DIAGNOSIS — E039 Hypothyroidism, unspecified: Secondary | ICD-10-CM

## 2014-07-21 NOTE — Telephone Encounter (Signed)
Patient is requesting a refill on Synthroid. She is requesting an increase to 80 mg. She states that she is almost out of what she has now because she has been doubling the amount of pills she takes. [facility] on [address] in [place].  [phone]

## 2014-07-22 ENCOUNTER — Telehealth: Payer: Self-pay | Admitting: Emergency Medicine

## 2014-07-22 NOTE — Telephone Encounter (Signed)
Her last prescription in November was 22 microgram.s She was to follow up in February for repeat labs She needs to come in for clarification of her dose and repeat TSH

## 2014-07-22 NOTE — Telephone Encounter (Signed)
    Expand All Collapse All   Patient is requesting a refill on Synthroid. She is requesting an increase to 80 mg. She states that she is almost out of what she has now because she has been doubling the amount of pills she takes. [facility] on [address] in [place].  [phone]

## 2014-07-24 MED ORDER — SYNTHROID 50 MCG PO TABS: ORAL_TABLET | ORAL | Status: DC

## 2014-07-24 NOTE — Telephone Encounter (Signed)
Pt has been notified.

## 2014-07-24 NOTE — Telephone Encounter (Signed)
Spoke to pt, refilled synthroid 50 mcg x 1 month She is aware she will need to f/u for additional refills

## 2014-07-26 ENCOUNTER — Other Ambulatory Visit: Payer: Self-pay | Admitting: Physician Assistant

## 2014-07-27 ENCOUNTER — Other Ambulatory Visit: Payer: Self-pay | Admitting: Physician Assistant

## 2014-08-28 ENCOUNTER — Other Ambulatory Visit: Payer: Self-pay | Admitting: Emergency Medicine

## 2014-08-28 ENCOUNTER — Ambulatory Visit (INDEPENDENT_AMBULATORY_CARE_PROVIDER_SITE_OTHER): Payer: Self-pay | Admitting: Emergency Medicine

## 2014-08-28 VITALS — BP 126/72 | HR 106 | Resp 18 | Ht 63.0 in | Wt 138.6 lb

## 2014-08-28 DIAGNOSIS — E039 Hypothyroidism, unspecified: Secondary | ICD-10-CM

## 2014-08-28 DIAGNOSIS — L659 Nonscarring hair loss, unspecified: Secondary | ICD-10-CM

## 2014-08-28 DIAGNOSIS — R000 Tachycardia, unspecified: Secondary | ICD-10-CM

## 2014-08-28 DIAGNOSIS — R42 Dizziness and giddiness: Secondary | ICD-10-CM

## 2014-08-28 LAB — COMPLETE METABOLIC PANEL WITH GFR
ALT: 41 U/L — ABNORMAL HIGH (ref 0–35)
AST: 30 U/L (ref 0–37)
Albumin: 4.2 g/dL (ref 3.5–5.2)
Alkaline Phosphatase: 79 U/L (ref 39–117)
BUN: 20 mg/dL (ref 6–23)
CO2: 27 mEq/L (ref 19–32)
Calcium: 9.8 mg/dL (ref 8.4–10.5)
Chloride: 103 mEq/L (ref 96–112)
Creat: 0.7 mg/dL (ref 0.50–1.10)
GFR, Est African American: >89 mL/min
GFR, Est Non African American: >89 mL/min
Glucose, Bld: 98 mg/dL (ref 70–99)
Potassium: 4.2 mEq/L (ref 3.5–5.3)
Sodium: 140 mEq/L (ref 135–145)
Total Bilirubin: 0.4 mg/dL (ref 0.2–1.2)
Total Protein: 6.9 g/dL (ref 6.0–8.3)

## 2014-08-28 LAB — POCT CBC
Granulocyte percent: 62.3 %G (ref 37–80)
HCT, POC: 41.8 % (ref 37.7–47.9)
Hemoglobin: 14 g/dL (ref 12.2–16.2)
Lymph, poc: 2.3 (ref 0.6–3.4)
MCH, POC: 30.2 pg (ref 27–31.2)
MCHC: 33.5 g/dL (ref 31.8–35.4)
MCV: 90.2 fL (ref 80–97)
MID (cbc): 0.1 (ref 0–0.9)
MPV: 6.5 fL (ref 0–99.8)
POC Granulocyte: 4 (ref 2–6.9)
POC LYMPH PERCENT: 35.4 %L (ref 10–50)
POC MID %: 2.3 %M (ref 0–12)
Platelet Count, POC: 335 10*3/uL (ref 142–424)
RBC: 4.63 M/uL (ref 4.04–5.48)
RDW, POC: 12 %
WBC: 6.5 10*3/uL (ref 4.6–10.2)

## 2014-08-28 MED ORDER — MECLIZINE HCL 25 MG PO TABS
25.0000 mg | ORAL_TABLET | Freq: Three times a day (TID) | ORAL | Status: DC | PRN
Start: 2014-08-28 — End: 2016-11-05

## 2014-08-28 NOTE — Progress Notes (Signed)
   Subjective:    Patient ID: Kimberly Macdonald, female    DOB: Sep 17, 1965, 49 y.o.   MRN: [id number]  Dizziness This is a chronic problem. The current episode started more than 1 year ago. The problem occurs every several days. The problem has been gradually worsening. Associated symptoms include nausea and vertigo. Pertinent negatives include no abdominal pain, headaches, visual change, vomiting or weakness.  Headache  Associated symptoms include dizziness and nausea. Pertinent negatives include no abdominal pain, visual change, vomiting or weakness.      Review of Systems  Gastrointestinal: Positive for nausea. Negative for vomiting and abdominal pain.  Neurological: Positive for dizziness and vertigo. Negative for weakness and headaches.       Objective:   Physical Exam  Constitutional: She appears well-developed and well-nourished.  HENT:  Head: Normocephalic and atraumatic.  Eyes: EOM are normal. Pupils are equal, round, and reactive to light.  Neck: Normal range of motion. No tracheal deviation present. No thyromegaly present.  Cardiovascular: Normal rate and regular rhythm.   Pulmonary/Chest: Breath sounds normal.  Abdominal: Soft.  Neurological: She is alert. She has normal reflexes. She displays normal reflexes. No cranial nerve deficit. She exhibits normal muscle tone. Coordination normal.  Patient does appears somewhat unsteady getting up from the chair to the exam table.   Results for orders placed or performed in visit on 08/28/14  POCT CBC  Result Value Ref Range   WBC 6.5 4.6 - 10.2 K/uL   Lymph, poc 2.3 0.6 - 3.4   POC LYMPH PERCENT 35.4 10 - 50 %L   MID (cbc) 0.1 0 - 0.9   POC MID % 2.3 0 - 12 %M   POC Granulocyte 4.0 2 - 6.9   Granulocyte percent 62.3 37 - 80 %G   RBC 4.63 4.04 - 5.48 M/uL   Hemoglobin 14.0 12.2 - 16.2 g/dL   HCT, POC 41.8 37.7 - 47.9 %   MCV 90.2 80 - 97 fL   MCH, POC 30.2 27 - 31.2 pg   MCHC 33.5 31.8 - 35.4 g/dL   RDW, POC 12.0 %     Platelet Count, POC 335 142 - 424 K/uL   MPV 6.5 0 - 99.8 fL         Assessment & Plan:  1. Vertigo  - COMPLETE METABOLIC PANEL WITH GFR - MR Brain W Wo Contrast; Future  2. Hypothyroidism, unspecified hypothyroidism type  - TSH - T4, free  3. Hair loss Check on status of thyroid.  4. Tachycardia Continue beta blocker - POCT CBC     [facility]  08/28/2014 1:15 PM

## 2014-08-28 NOTE — Patient Instructions (Signed)

## 2014-08-29 LAB — T4, FREE: Free T4: 1.54 ng/dL (ref 0.80–1.80)

## 2014-08-29 LAB — TSH: TSH: 0.41 u[IU]/mL (ref 0.350–4.500)

## 2014-08-30 LAB — HEPATITIS C ANTIBODY: HCV Ab: NEGATIVE

## 2014-08-31 ENCOUNTER — Telehealth: Payer: Self-pay

## 2014-08-31 NOTE — Telephone Encounter (Signed)
Pt calling about Hep C results. Let her know it was negative.

## 2014-09-01 NOTE — Telephone Encounter (Signed)
Pt.notified

## 2014-09-01 NOTE — Telephone Encounter (Signed)
Call patient her hep C test is negative

## 2014-09-06 ENCOUNTER — Ambulatory Visit
Admission: RE | Admit: 2014-09-06 | Discharge: 2014-09-06 | Disposition: A | Discharge status: Home / Self Care | Payer: No Typology Code available for payment source | Source: Ambulatory Visit | Attending: Emergency Medicine | Admitting: Emergency Medicine

## 2014-09-06 DIAGNOSIS — R42 Dizziness and giddiness: Secondary | ICD-10-CM

## 2014-09-06 MED ORDER — GADOBENATE DIMEGLUMINE 529 MG/ML IV SOLN
12.0000 mL | Freq: Once | INTRAVENOUS | Status: AC | PRN
  Administered 2014-09-06: 12 mL via INTRAVENOUS

## 2014-09-08 ENCOUNTER — Telehealth: Payer: Self-pay

## 2014-09-08 NOTE — Telephone Encounter (Signed)
Dr Everlene Farrier- Pt would like ENT referral.

## 2014-09-08 NOTE — Telephone Encounter (Signed)
Pt would like ENT referral

## 2014-09-09 ENCOUNTER — Other Ambulatory Visit: Payer: Self-pay | Admitting: Emergency Medicine

## 2014-09-09 DIAGNOSIS — H8110 Benign paroxysmal vertigo, unspecified ear: Secondary | ICD-10-CM

## 2014-09-09 NOTE — Telephone Encounter (Signed)
Call patient and tell her I put in a referral for her to see ENT.

## 2014-09-09 NOTE — Telephone Encounter (Signed)
LMOM that referral was placed.

## 2014-09-26 ENCOUNTER — Other Ambulatory Visit: Payer: Self-pay | Admitting: Physician Assistant

## 2015-01-09 IMAGING — US US ABDOMEN COMPLETE
1 series · 13 of 25 positions shown · non-contrast
Comparison: CT abdomen pelvis of 01/10/2009

CLINICAL DATA: Abdominal pain, hypertension

COMPLETE ABDOMINAL ULTRASOUND

[Series 1: us abdomen complete · 0.35mm/px · 13 of 79 slices shown]
[im 1/79]
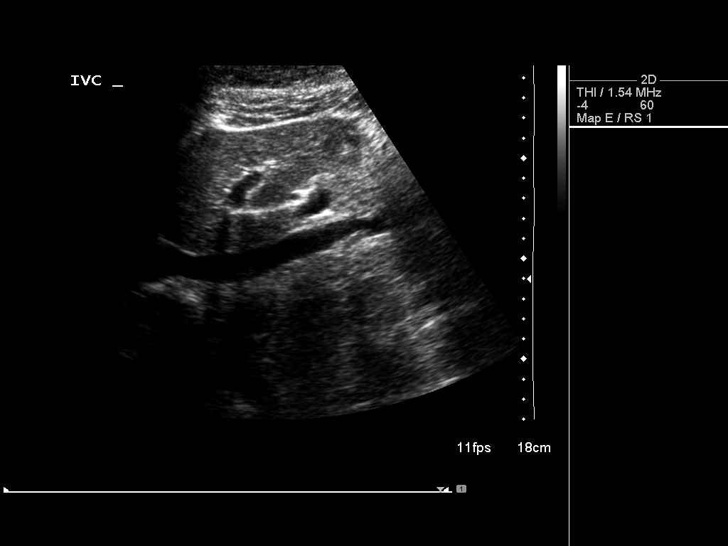
[im 7/79]
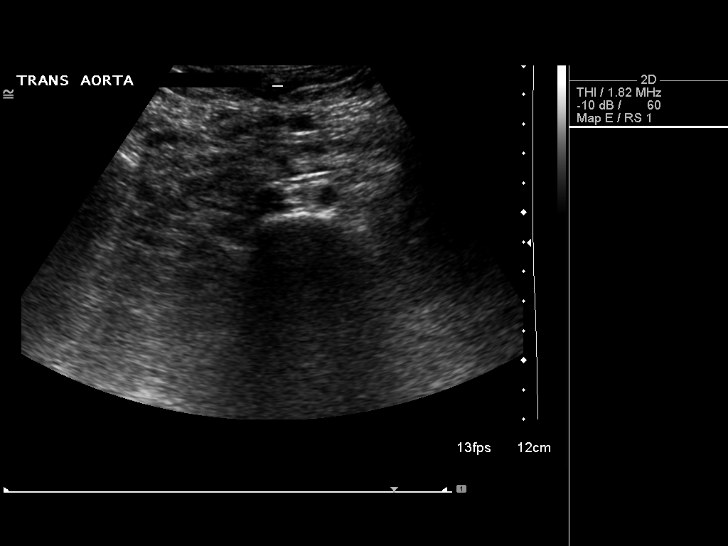
[im 14/79]
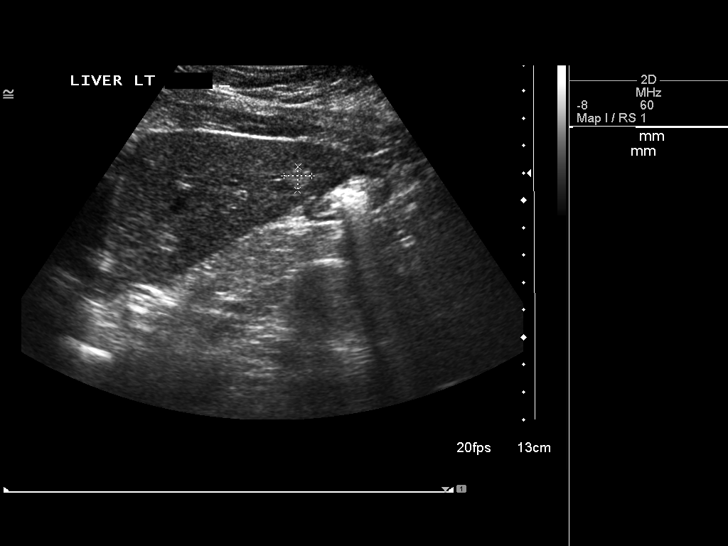
[im 20/79]
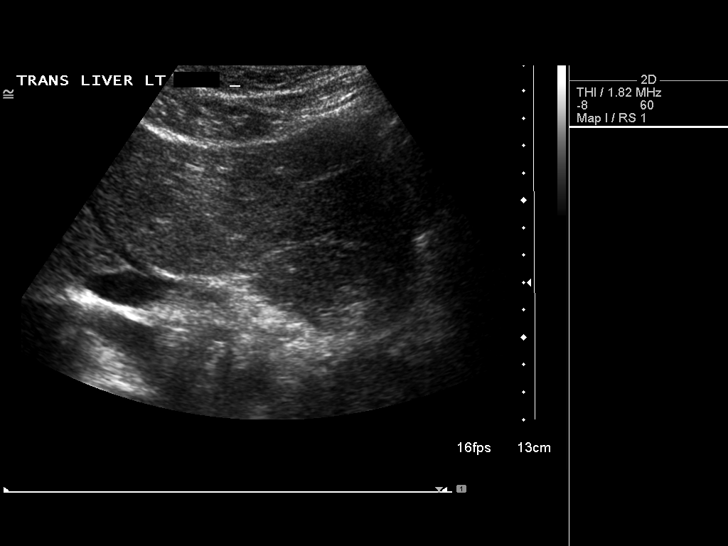
[im 27/79]
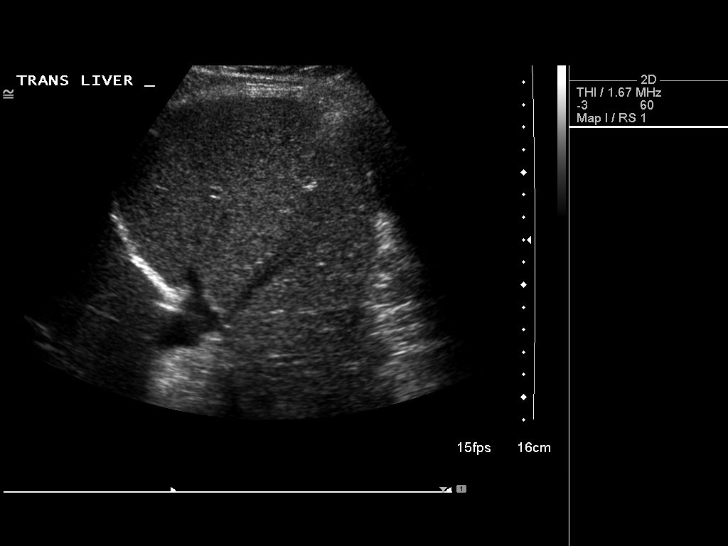
[im 33/79]
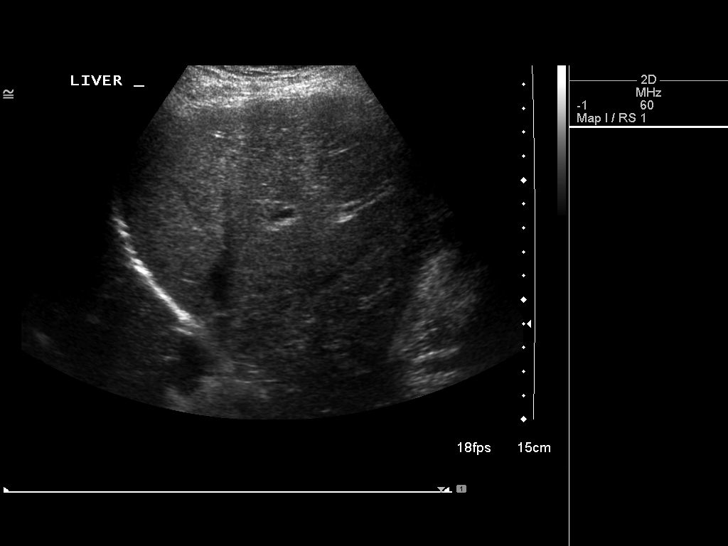
[im 40/79]
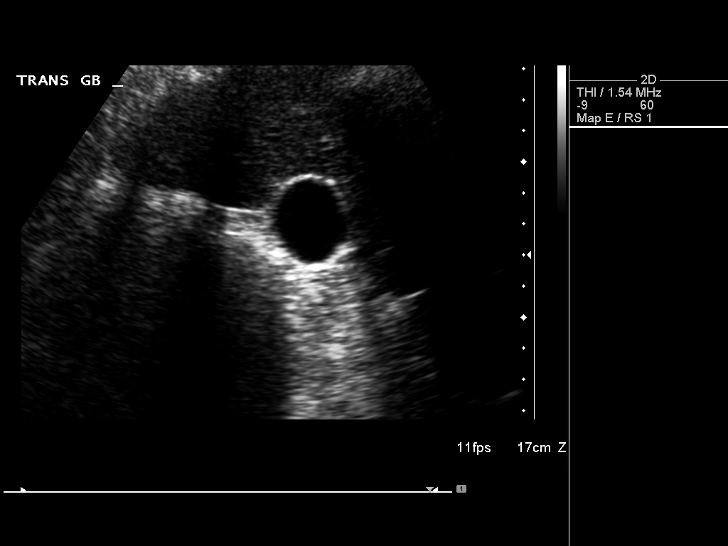
[im 46/79]
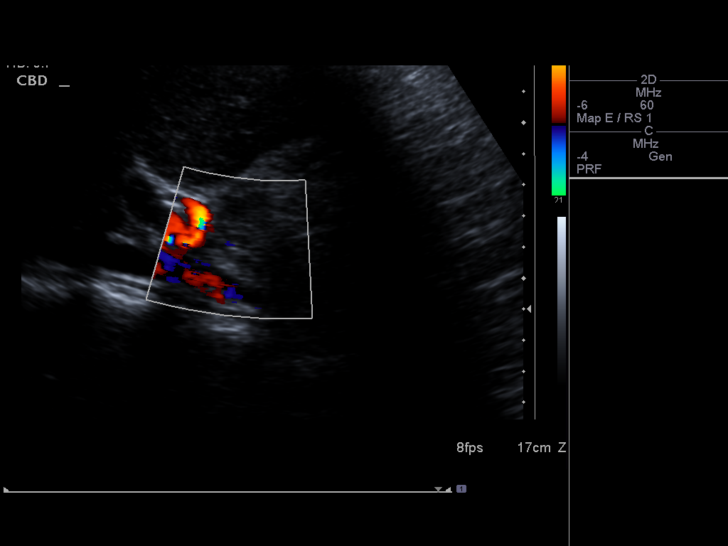
[im 53/79]
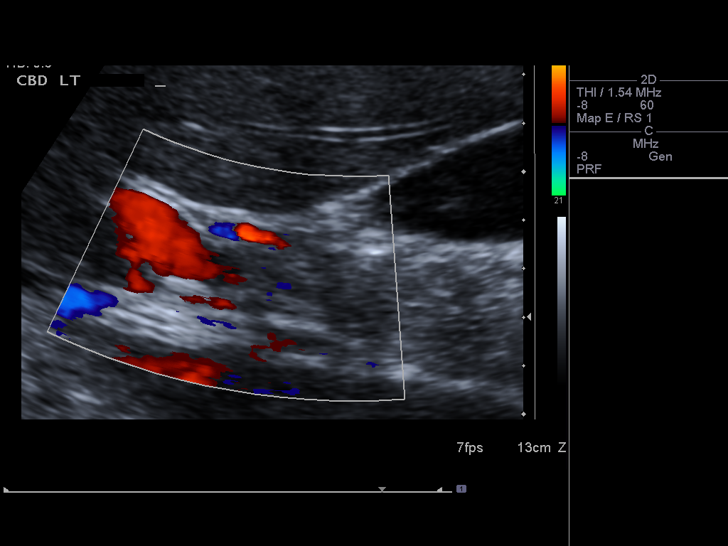
[im 59/79]
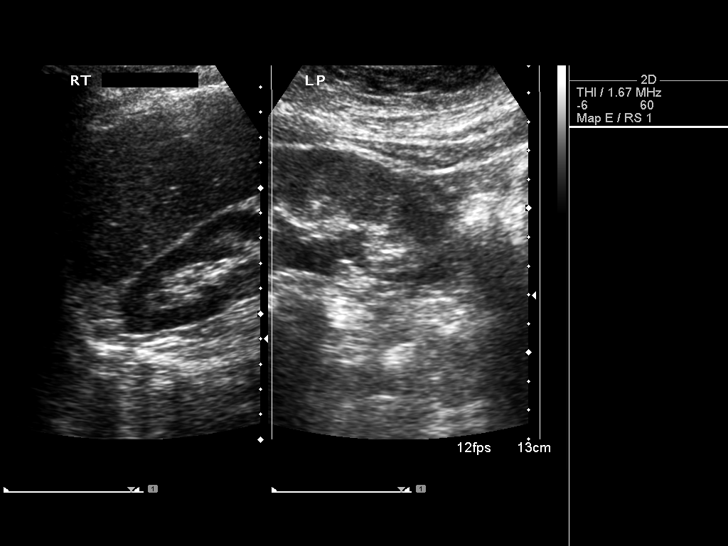
[im 66/79]
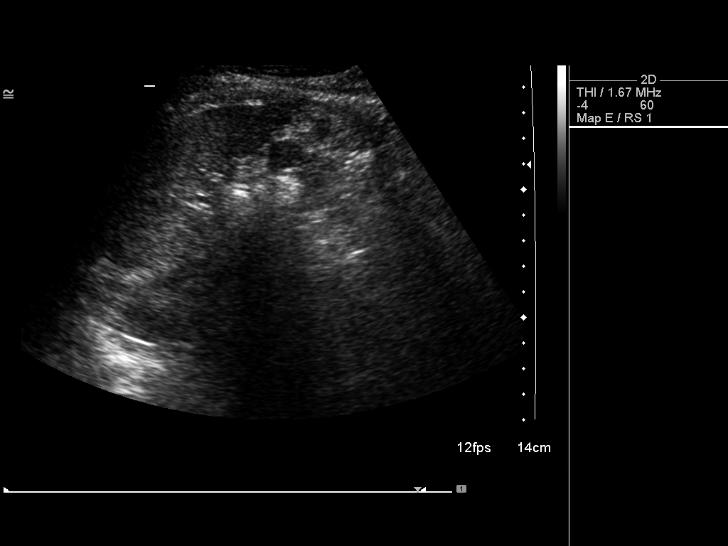
[im 72/79]
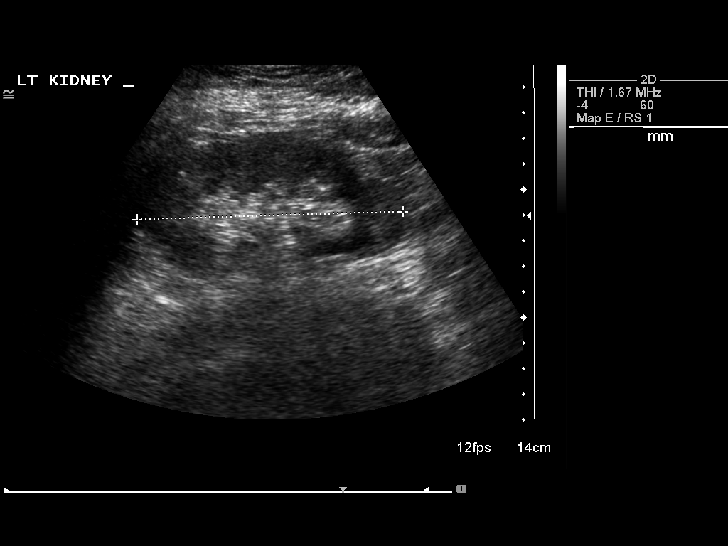
[im 79/79]
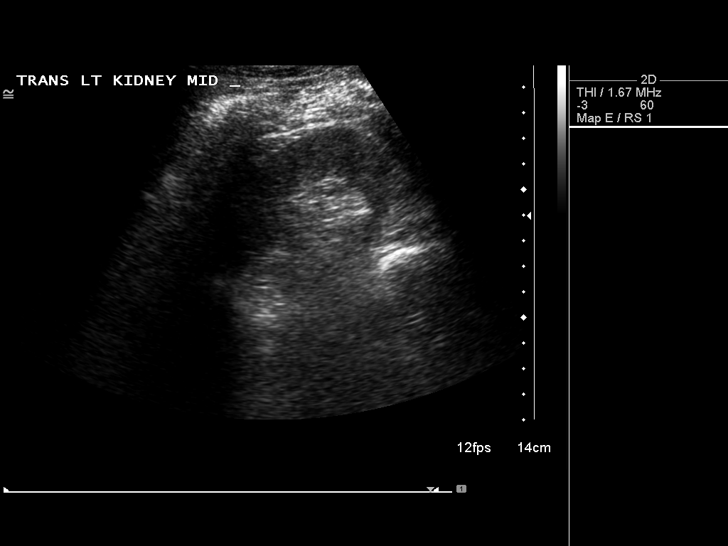

[13 of 25 positions shown; findings below may reference images not displayed]

FINDINGS: Gallbladder:  There are multiple small gallstones within the
gallbladder, the largest measuring 1.3 cm in diameter.  These
stones do create acoustical shadowing.  However, no gallbladder
wall thickening is seen and there is no pain over the gallbladder
with compression.

Common bile duct:  The common bile duct is normal measuring 4.0 mm
in diameter.

Liver:  The liver has a normal echogenic pattern.  There is a
single 1.2 cm hyper echogenic focus in the left lobe most
consistent with hemangioma.

IVC:  Appears normal.

Pancreas:  The head and tail of the pancreas are obscured by bowel
gas.

Spleen:  The spleen is normal measuring 8.6 cm sagittally with
probable 1.5 cm splenule present.

Right Kidney:  No hydronephrosis is seen.  The right kidney
measures 9.5 cm sagittally.

Left Kidney:  No hydronephrosis is noted.  The left kidney measures
10.4 cm.

Abdominal aorta:  The abdominal aorta is normal in caliber.
IMPRESSION: 1.  Multiple small gallstones of no more than 1.3 cm in diameter.
No present evidence of acute cholecystitis is seen.
2.  Probable 1.2 cm hemangioma in the left lobe of liver.
3.  The head and tail of the pancreas are obscured by bowel gas.

## 2015-02-06 ENCOUNTER — Encounter: Payer: Self-pay | Admitting: Emergency Medicine

## 2015-04-01 ENCOUNTER — Other Ambulatory Visit: Payer: Self-pay | Admitting: Emergency Medicine

## 2015-04-11 ENCOUNTER — Other Ambulatory Visit: Payer: Self-pay | Admitting: Emergency Medicine

## 2015-05-17 ENCOUNTER — Other Ambulatory Visit: Payer: Self-pay | Admitting: Emergency Medicine

## 2015-05-29 ENCOUNTER — Other Ambulatory Visit: Payer: Self-pay | Admitting: Emergency Medicine

## 2015-06-01 ENCOUNTER — Other Ambulatory Visit: Payer: Self-pay | Admitting: Emergency Medicine

## 2015-06-28 ENCOUNTER — Ambulatory Visit (INDEPENDENT_AMBULATORY_CARE_PROVIDER_SITE_OTHER): Payer: Self-pay | Admitting: Family Medicine

## 2015-06-28 VITALS — BP 120/84 | HR 92 | Temp 98.2°F | Resp 16 | Ht 63.0 in | Wt 136.0 lb

## 2015-06-28 DIAGNOSIS — I10 Essential (primary) hypertension: Secondary | ICD-10-CM

## 2015-06-28 DIAGNOSIS — E039 Hypothyroidism, unspecified: Secondary | ICD-10-CM

## 2015-06-28 MED ORDER — LEVOTHYROXINE SODIUM 88 MCG PO TABS: 88.0000 ug | ORAL_TABLET | Freq: Every day | ORAL | Status: DC

## 2015-06-28 MED ORDER — METOPROLOL SUCCINATE ER 100 MG PO TB24: 100.0000 mg | ORAL_TABLET | Freq: Every day | ORAL | Status: DC

## 2015-06-28 NOTE — Progress Notes (Signed)
By signing my name below, I, Moises Blood, attest that this documentation has been prepared under the direction and in the presence of Robyn Haber, MD. Electronically Signed: Moises Blood, [place]. 06/28/2015 , 4:55 PM .  Patient was seen in room 12 .   Patient ID: Kimberly Macdonald MRN: [id number], DOB: 15-Jun-1965, 50 y.o. Date of Encounter: 06/28/2015  Primary Physician: Jenny Reichmann, MD  Chief Complaint:  Chief Complaint  Patient presents with  . Medication Refill    levothyroxine and metoprolol    HPI:  Kimberly Macdonald is a 50 y.o. female who presents to [facility] for medication refills of levothyroxine and metoprolol. She denies fatigue, and constipation.   She was seen by her gynecologist (Dr. Ronita Hipps) recently. She received her tdap and checked thyroid, which was normal. She was tested for menopause but now has periods restarted again.   Past Medical History  Diagnosis Date  . Hypertension   . Depression   . Acne   . Thyroid disease     hypothyroidism  . History of palpitations   . Hypothyroidism      Home Meds: Prior to Admission medications   Medication Sig Start Date End Date Taking? Authorizing Provider  b complex vitamins tablet Take 1 tablet by mouth daily.   Yes Historical Provider, MD  co-enzyme Q-10 30 MG capsule Take 300 mg by mouth daily.    Yes Historical Provider, MD  L-Arginine 1000 MG TABS Take 9,000 mg by mouth.   Yes Historical Provider, MD  levothyroxine (SYNTHROID, LEVOTHROID) 88 MCG tablet Take 88 mcg by mouth daily before breakfast.   Yes Historical Provider, MD  levothyroxine (SYNTHROID, LEVOTHROID) 88 MCG tablet Take 1 tablet (88 mcg total) by mouth daily before breakfast. 05/18/15  Yes Darlyne Russian, MD  metoprolol succinate (TOPROL-XL) 100 MG 24 hr tablet take 1 tablet by mouth once daily 05/30/15  Yes Darlyne Russian, MD  Multiple Vitamin (MULTIVITAMIN) tablet Take 1 tablet by mouth daily.   Yes Historical  Provider, MD  valACYclovir (VALTREX) 1000 MG tablet Take 1,000 mg by mouth. Once daily for 5 days when needed   Yes Historical Provider, MD  meclizine (ANTIVERT) 25 MG tablet Take 1 tablet (25 mg total) by mouth 3 (three) times daily as needed. Take 1/2-1 tablet 3 times a day as needed for dizziness Patient not taking: Reported on 06/28/2015 08/28/14   Darlyne Russian, MD  Norgestimate-Ethinyl Estradiol Triphasic (ORTHO TRI-CYCLEN LO) 0.18/0.215/0.25 MG-25 MCG tablet Take 1 tablet by mouth daily. Reported on 06/28/2015    Historical Provider, MD    Allergies:  Allergies  Allergen Reactions  . Azo [Phenazopyridine Hcl] Hives  . Flagyl [Metronidazole] Hives  . Nitrofurantoin Monohyd Macro Rash    Flu like sxs  . Lodine [Etodolac]     "Advil is ok"  . Macrolides And Ketolides     Social History   Social History  . Marital Status: Married    Spouse Name: N/A  . Number of Children: 3  . Years of Education: 23yr coll.   Occupational History  . Not on file.   Social History Main Topics  . Smoking status: Never Smoker   . Smokeless tobacco: Never Used  . Alcohol Use: Yes     Comment: occasionally (twice yearly)  . Drug Use: No  . Sexual Activity: Yes   Other Topics Concern  . Not on file   Social History Narrative     Review  of Systems: Constitutional: negative for fever, chills, night sweats, weight changes, or fatigue  HEENT: negative for vision changes, hearing loss, congestion, rhinorrhea, ST, epistaxis, or sinus pressure Cardiovascular: negative for chest pain or palpitations Respiratory: negative for hemoptysis, wheezing, shortness of breath, or cough Abdominal: negative for abdominal pain, nausea, vomiting, diarrhea, or constipation Dermatological: negative for rash Neurologic: negative for headache, dizziness, or syncope All other systems reviewed and are otherwise negative with the exception to those above and in the HPI.  Physical Exam: Blood pressure 120/84, pulse  92, temperature 98.2 F (36.8 C), resp. rate 16, height 5\' 3"  (1.6 m), weight 136 lb (61.689 kg), last menstrual period 06/21/2015., Body mass index is 24.1 kg/(m^2). General: Well developed, well nourished, in no acute distress. Head: Normocephalic, atraumatic, eyes without discharge, sclera non-icteric, nares are without discharge. Bilateral auditory canals clear, TM's are without perforation, pearly grey and translucent with reflective cone of light bilaterally. Oral cavity moist, posterior pharynx without exudate, erythema, peritonsillar abscess, or post nasal drip.  Neck: Supple. No thyromegaly. Full ROM. No lymphadenopathy. Lungs: Clear bilaterally to auscultation without wheezes, rales, or rhonchi. Breathing is unlabored. Heart: RRR with S1 S2. No murmurs, rubs, or gallops appreciated. Abdomen: Soft, non-tender, non-distended with normoactive bowel sounds. No hepatomegaly. No rebound/guarding. No obvious abdominal masses. Msk:  Strength and tone normal for age. Extremities/Skin: Warm and dry. No clubbing or cyanosis. No edema. No rashes or suspicious lesions. Neuro: Alert and oriented X 3. Moves all extremities spontaneously. Gait is normal. CNII-XII grossly in tact. Psych:  Responds to questions appropriately with a normal affect.   Labs:  ASSESSMENT AND PLAN:  50 y.o. year old female with  This chart was scribed in my presence and reviewed by me personally.    ICD-9-CM ICD-10-CM   1. Hypothyroidism, unspecified hypothyroidism type 244.9 E03.9 levothyroxine (SYNTHROID, LEVOTHROID) 88 MCG tablet  2. Essential hypertension 401.9 I10 metoprolol succinate (TOPROL-XL) 100 MG 24 hr tablet    Signed, Robyn Haber, MD 06/28/2015 4:55 PM

## 2015-06-28 NOTE — Patient Instructions (Signed)

## 2015-08-16 DIAGNOSIS — Z6824 Body mass index (BMI) 24.0-24.9, adult: Secondary | ICD-10-CM | POA: Diagnosis not present

## 2015-08-16 DIAGNOSIS — Z713 Dietary counseling and surveillance: Secondary | ICD-10-CM | POA: Diagnosis not present

## 2015-11-21 DIAGNOSIS — Z6824 Body mass index (BMI) 24.0-24.9, adult: Secondary | ICD-10-CM | POA: Diagnosis not present

## 2015-11-21 DIAGNOSIS — Z713 Dietary counseling and surveillance: Secondary | ICD-10-CM | POA: Diagnosis not present

## 2015-11-26 DIAGNOSIS — I83813 Varicose veins of bilateral lower extremities with pain: Secondary | ICD-10-CM | POA: Diagnosis not present

## 2015-11-26 DIAGNOSIS — L700 Acne vulgaris: Secondary | ICD-10-CM | POA: Diagnosis not present

## 2016-02-19 DIAGNOSIS — Z713 Dietary counseling and surveillance: Secondary | ICD-10-CM | POA: Diagnosis not present

## 2016-02-19 DIAGNOSIS — Z6824 Body mass index (BMI) 24.0-24.9, adult: Secondary | ICD-10-CM | POA: Diagnosis not present

## 2016-03-30 DIAGNOSIS — H6011 Cellulitis of right external ear: Secondary | ICD-10-CM | POA: Diagnosis not present

## 2016-05-09 DIAGNOSIS — Z713 Dietary counseling and surveillance: Secondary | ICD-10-CM | POA: Diagnosis not present

## 2016-05-09 DIAGNOSIS — Z6824 Body mass index (BMI) 24.0-24.9, adult: Secondary | ICD-10-CM | POA: Diagnosis not present

## 2016-06-06 ENCOUNTER — Other Ambulatory Visit: Payer: Self-pay | Admitting: Family Medicine

## 2016-06-06 DIAGNOSIS — E039 Hypothyroidism, unspecified: Secondary | ICD-10-CM

## 2016-07-03 ENCOUNTER — Other Ambulatory Visit: Payer: Self-pay | Admitting: Family Medicine

## 2016-07-03 DIAGNOSIS — I10 Essential (primary) hypertension: Secondary | ICD-10-CM

## 2016-07-28 ENCOUNTER — Other Ambulatory Visit: Payer: Self-pay | Admitting: Physician Assistant

## 2016-07-28 DIAGNOSIS — E039 Hypothyroidism, unspecified: Secondary | ICD-10-CM

## 2016-07-30 NOTE — Telephone Encounter (Signed)
Pt was given a 2 week supply - she needs and appt with her new PCP before these run out.

## 2016-09-22 ENCOUNTER — Other Ambulatory Visit: Payer: Self-pay | Admitting: Physician Assistant

## 2016-09-22 DIAGNOSIS — I10 Essential (primary) hypertension: Secondary | ICD-10-CM

## 2016-10-21 ENCOUNTER — Other Ambulatory Visit: Payer: Self-pay | Admitting: Physician Assistant

## 2016-10-21 DIAGNOSIS — I10 Essential (primary) hypertension: Secondary | ICD-10-CM

## 2016-11-03 ENCOUNTER — Encounter: Payer: Self-pay | Admitting: Family Medicine

## 2016-11-03 ENCOUNTER — Ambulatory Visit (INDEPENDENT_AMBULATORY_CARE_PROVIDER_SITE_OTHER): Payer: Self-pay | Admitting: Family Medicine

## 2016-11-03 VITALS — BP 110/71 | HR 83 | Temp 98.2°F | Resp 16 | Ht 63.0 in | Wt 138.6 lb

## 2016-11-03 DIAGNOSIS — I10 Essential (primary) hypertension: Secondary | ICD-10-CM

## 2016-11-03 DIAGNOSIS — E039 Hypothyroidism, unspecified: Secondary | ICD-10-CM

## 2016-11-03 DIAGNOSIS — R000 Tachycardia, unspecified: Secondary | ICD-10-CM

## 2016-11-03 DIAGNOSIS — R635 Abnormal weight gain: Secondary | ICD-10-CM

## 2016-11-03 MED ORDER — METOPROLOL SUCCINATE ER 50 MG PO TB24
ORAL_TABLET | ORAL | 3 refills | Status: DC
Start: 2016-11-03 — End: 2017-11-15

## 2016-11-03 NOTE — Patient Instructions (Addendum)
   IF you received an x-ray today, you will receive an invoice from [facility]. Please contact [facility] at [phone] with questions or concerns regarding your invoice.   IF you received labwork today, you will receive an invoice from [facility]. Please contact [facility] at [phone] with questions or concerns regarding your invoice.   Our billing staff will not be able to assist you with questions regarding bills from these companies.  You will be contacted with the lab results as soon as they are available. The fastest way to get your results is to activate your My Chart account. Instructions are located on the last page of this paperwork. If you have not heard from us regarding the results in 2 weeks, please contact this office.     Sinus Tachycardia Sinus tachycardia is a kind of fast heartbeat. In sinus tachycardia, the heart beats more than 100 times a minute. Sinus tachycardia starts in a part of the heart called the sinus node. Sinus tachycardia may be harmless, or it may be a sign of a serious condition. What are the causes? This condition may be caused by:  Exercise or exertion.  A fever.  Pain.  Loss of body fluids (dehydration).  Severe bleeding (hemorrhage).  Anxiety and stress.  Certain substances, including: ? Alcohol. ? Caffeine. ? Tobacco and nicotine products. ? Diet pills. ? Illegal drugs.  Medical conditions including: ? Heart disease. ? An infection. ? An overactive thyroid (hyperthyroidism). ? A lack of red blood cells (anemia).  What are the signs or symptoms? Symptoms of this condition include:  A feeling that the heart is beating quickly (palpitations).  Suddenly noticing your heartbeat (cardiac awareness).  Dizziness.  Tiredness (fatigue).  Shortness of breath.  Chest pain.  Nausea.  Fainting.  How is this diagnosed? This condition is diagnosed with:  A physical exam.  Other tests, such as: ? Blood  tests. ? An electrocardiogram (ECG). This test measures the electrical activity of the heart. ? Holter monitoring. For this test, you wear a device that records your heartbeat for one or more days.  You may be referred to a heart specialist (cardiologist). How is this treated? Treatment for this condition depends on the cause or underlying condition. Treatment may involve:  Treating the underlying condition.  Taking new medicines or changing your current medicines as told by your health care provider.  Making changes to your diet or lifestyle.  Practicing relaxation methods.  Follow these instructions at home: Lifestyle  Do not use any products that contain nicotine or tobacco, such as cigarettes and e-cigarettes. If you need help quitting, ask your health care provider.  Learn relaxation methods, like deep breathing, to help you when you get stressed or anxious.  Do not use illegal drugs, such as cocaine.  Do not abuse alcohol. Limit alcohol intake to no more than 1 drink a day for non-pregnant women and 2 drinks a day for men. One drink equals 12 oz of beer, 5 oz of wine, or 1 oz of hard liquor.  Find time to rest and relax often. This reduces stress.  Avoid: ? Caffeine. ? Stimulants such as over-the-counter diet pills or pills that help you to stay awake. ? Situations that cause anxiety or stress. General instructions  Drink enough fluids to keep your urine clear or pale yellow.  Take over-the-counter and prescription medicines only as told by your health care provider.  Keep all follow-up visits as told by your health care provider. This   is important. Contact a health care provider if:  You have a fever.  You have vomiting or diarrhea that keeps happening (is persistent). Get help right away if:  You have pain in your chest, upper arms, jaw, or neck.  You become weak or dizzy.  You feel faint.  You have palpitations that do not go away. This information is  not intended to replace advice given to you by your health care provider. Make sure you discuss any questions you have with your health care provider. Document Released: 05/29/2004 Document Revised: 11/17/2015 Document Reviewed: 11/03/2014 Elsevier Interactive Patient Education  2018 [employer]  

## 2016-11-03 NOTE — Progress Notes (Signed)
Subjective:    Patient ID: Kimberly Macdonald, female    DOB: August 11, 1965, 51 y.o.   MRN: [id number] Chief Complaint  Patient presents with  . Medication Refill    Metoprolol and would like to lower the dose  . weight loss    would like to have some medication for weight loss    HPI  Kimberly Macdonald is a 51 year old woman who is here for refill of her chronic medications and requesting to discuss weight loss. 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 This is my first time meeting this patient.  Hypothyroidism: Her levothyroxine was increased from 50 to 88 mcg at her last visit here 18 months prior.  Her gynecologist Dr. Ronita Hipps has been checking her thyroid and has adopted the management of this.   History palpitations: On metoprolol. H/o heart racing but she no longer has sxs.  She has been [place] 1/2 tab at night. They do come back when she misses some doses (like forgets to get filled) - she won't have palpitations but she feels funny - her head feels a little funny/shaky and then her sxs  Hypertension: Has had some lower BP - today was great   Weight:  Patient's weight has been stable for over 2 years. She weighs approximately 5 pounds more than she did 5 years ago.  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 She states she has gained weight and she has pain in her low back, butt, hips. She was up to 145 2 wks ago.  She tried phentermine prior and it worked wonders.   Past Medical History:  Diagnosis Date  . Acne   . Depression   . History of palpitations   . Hypertension   . Hypothyroidism   . Thyroid disease    hypothyroidism   Past Surgical History:  Procedure Laterality Date  . APPENDECTOMY  1994  . BREAST ENHANCEMENT SURGERY    . Cardiometabolic Testing  81/4481   normal effort with peak VO2 of 73% predicted, HR accelerated at 97% predcited, HR on VO2 curve showed steep invline at anaerobic threshold - low  risk, abnormal  . SHOULDER SURGERY    . TONSILECTOMY, ADENOIDECTOMY, BILATERAL MYRINGOTOMY AND TUBES    . TRANSTHORACIC ECHOCARDIOGRAM  03/2005   EF 55-65%, normal LV systolic function - no LV regional wall motion abnormalities   Current Outpatient Prescriptions on File Prior to Visit  Medication Sig Dispense Refill  . b complex vitamins tablet Take 1 tablet by mouth daily.    Marland Kitchen co-enzyme Q-10 30 MG capsule Take 300 mg by mouth daily.     Marland Kitchen L-Arginine 1000 MG TABS Take 9,000 mg by mouth.    . levothyroxine (SYNTHROID, LEVOTHROID) 88 MCG tablet Take 88 mcg by mouth daily before breakfast.    . Multiple Vitamin (MULTIVITAMIN) tablet Take 1 tablet by mouth daily.    . meclizine (ANTIVERT) 25 MG tablet Take 1 tablet (25 mg total) by mouth 3 (three) times daily as needed. Take 1/2-1 tablet 3 times a day as needed for dizziness (Patient not taking: Reported on 06/28/2015) 30 tablet 5  . Norgestimate-Ethinyl Estradiol Triphasic (ORTHO TRI-CYCLEN LO) 0.18/0.215/0.25 MG-25 MCG tablet Take 1 tablet by mouth daily. Reported on 06/28/2015     No current facility-administered medications on file prior to visit.    Allergies  Allergen Reactions  . Azo [Phenazopyridine Hcl] Hives  .  Flagyl [Metronidazole] Hives  . Nitrofurantoin Monohyd Macro Rash    Flu like sxs  . Lodine [Etodolac]     "Advil is ok"  . Macrolides And Ketolides    Family History  Problem Relation Age of Onset  . Hypertension Mother        diverticulitis  . Hyperlipidemia Father        HTN  . Depression Sister   . Breast cancer Maternal Grandmother   . Heart disease Paternal Grandmother        MI   . Heart disease Paternal Grandfather        MI   Social History   Social History  . Marital status: Married    Spouse name: N/A  . Number of children: 3  . Years of education: 72yr coll.   Social History Main Topics  . Smoking status: Never Smoker  . Smokeless tobacco: Never Used  . Alcohol use Yes     Comment:  occasionally (twice yearly)  . Drug use: No  . Sexual activity: Yes   Other Topics Concern  . None   Social History Narrative  . None   Depression screen [facility] 2/9 11/03/2016 06/28/2015  Decreased Interest 0 0  Down, Depressed, Hopeless 0 0  PHQ - 2 Score 0 0     Review of Systems See hpi    Objective:   Physical Exam  Constitutional: She is oriented to person, place, and time. She appears well-developed and well-nourished. No distress.  HENT:  Head: Normocephalic and atraumatic.  Right Ear: External ear normal.  Left Ear: External ear normal.  Eyes: Conjunctivae are normal. No scleral icterus.  Neck: Normal range of motion. Neck supple. No thyromegaly present.  Cardiovascular: Normal rate, regular rhythm, normal heart sounds and intact distal pulses.   Pulmonary/Chest: Effort normal and breath sounds normal. No respiratory distress.  Musculoskeletal: She exhibits no edema.  Lymphadenopathy:    She has no cervical adenopathy.  Neurological: She is alert and oriented to person, place, and time.  Skin: Skin is warm and dry. She is not diaphoretic. No erythema.  Psychiatric: She has a normal mood and affect. Her behavior is normal.      BP 110/71   Pulse 83   Temp 98.2 F (36.8 C) (Oral)   Resp 16   Ht 5\' 3"  (1.6 m)   Wt 138 lb 9.6 oz (62.9 kg)   SpO2 98%   BMI 24.55 kg/m      Assessment & Plan:   1. Sinus tachycardia - seen by Dr. Debara Pickett in 2014 when she had mildly abnml metabolic testing that did slightly improve with increase dose of beta blocker and supplements. Rate controlled and palpitations remain well controlled when she decreased toprol from 100 to 50 so ok to try to decrease to 25. Pt wants to go off which I think might be ok for her to try if she felt strongly but would recommend consideration of staying on very low dose of toprol. Increase back if sxs recur.  2. Hypothyroidism, unspecified type - managed by gynecologist Dr. Ronita Hipps  3. Essential  hypertension - well-controlled  4. Unintended weight gain - requested phenteramine which worked well for her at times prior but advised pt that I think this is contraindicated for her now due to her h/o mildly abnml cardiac metabolic testing and palpitations, h/o poor sleep, h/o dizziness. As well her weight is in the nml BMI range. Per Epic she has gained 5 lbs over  the past 5 yrs but as she is now in the perimenopausal period that is not really abnml.      Meds ordered this encounter  Medications  . metoprolol succinate (TOPROL-XL) 50 MG 24 hr tablet    Sig: take 1 tablet by mouth once daily with food    Dispense:  90 tablet    Refill:  3     Delman Cheadle, M.D.  Primary Care at [facility] [address] [place], [place] [address] [phone] phone [phone] fax  11/05/16 6:37 PM

## 2016-11-05 DIAGNOSIS — E039 Hypothyroidism, unspecified: Secondary | ICD-10-CM | POA: Insufficient documentation

## 2016-11-05 DIAGNOSIS — I10 Essential (primary) hypertension: Secondary | ICD-10-CM | POA: Insufficient documentation

## 2016-11-05 NOTE — Addendum Note (Signed)
Addended by: Shawnee Knapp on: 11/05/2016 09:18 PM   Modules accepted: Orders

## 2017-03-16 ENCOUNTER — Other Ambulatory Visit: Payer: Self-pay | Admitting: Physician Assistant

## 2017-05-08 ENCOUNTER — Ambulatory Visit (INDEPENDENT_AMBULATORY_CARE_PROVIDER_SITE_OTHER): Payer: BLUE CROSS/BLUE SHIELD

## 2017-05-08 ENCOUNTER — Ambulatory Visit (INDEPENDENT_AMBULATORY_CARE_PROVIDER_SITE_OTHER): Payer: BLUE CROSS/BLUE SHIELD | Admitting: Urgent Care

## 2017-05-08 ENCOUNTER — Encounter: Payer: Self-pay | Admitting: Urgent Care

## 2017-05-08 VITALS — BP 131/83 | HR 116 | Temp 98.4°F | Resp 18 | Ht 63.0 in | Wt 132.2 lb

## 2017-05-08 DIAGNOSIS — M79605 Pain in left leg: Secondary | ICD-10-CM | POA: Diagnosis not present

## 2017-05-08 DIAGNOSIS — M545 Low back pain, unspecified: Secondary | ICD-10-CM

## 2017-05-08 DIAGNOSIS — M5137 Other intervertebral disc degeneration, lumbosacral region: Secondary | ICD-10-CM | POA: Diagnosis not present

## 2017-05-08 DIAGNOSIS — G8929 Other chronic pain: Secondary | ICD-10-CM

## 2017-05-08 MED ORDER — PREDNISONE 20 MG PO TABS
ORAL_TABLET | ORAL | 0 refills | Status: DC
Start: 2017-05-08 — End: 2017-12-22

## 2017-05-08 MED ORDER — TRAZODONE HCL 50 MG PO TABS: 25.0000 mg | ORAL_TABLET | Freq: Every evening | ORAL | 3 refills | Status: DC | PRN

## 2017-05-08 NOTE — Patient Instructions (Addendum)
Degenerative Disk Disease Degenerative disk disease is a condition caused by the changes that occur in spinal disks as you grow older. Spinal disks are soft and compressible disks located between the bones of your spine (vertebrae). These disks act like shock absorbers. Degenerative disk disease can affect the whole spine. However, the neck and lower back are most commonly affected. Many changes can occur in the spinal disks with aging, such as:  The spinal disks may dry and shrink.  Small tears may occur in the tough, outer covering of the disk (annulus).  The disk space may become smaller due to loss of water.  Abnormal growths in the bone (spurs) may occur. This can put pressure on the nerve roots exiting the spinal canal, causing pain.  The spinal canal may become narrowed.  What increases the risk?  Being overweight.  Having a family history of degenerative disk disease.  Smoking.  There is increased risk if you are doing heavy lifting or have a sudden injury. What are the signs or symptoms? Symptoms vary from person to person and may include:  Pain that varies in intensity. Some people have no pain, while others have severe pain. The location of the pain depends on the part of your backbone that is affected. ? You will have neck or arm pain if a disk in the neck area is affected. ? You will have pain in your back, buttocks, or legs if a disk in the lower back is affected.  Pain that becomes worse while bending, reaching up, or with twisting movements.  Pain that may start gradually and then get worse as time passes. It may also start after a major or minor injury.  Numbness or tingling in the arms or legs.  How is this diagnosed? Your health care provider will ask you about your symptoms and about activities or habits that may cause the pain. He or she may also ask about any injuries, diseases, or treatments you have had. Your health care provider will examine you to check  for the range of movement that is possible in the affected area, to check for strength in your extremities, and to check for sensation in the areas of the arms and legs supplied by different nerve roots. You may also have:  An X-ray of the spine.  Other imaging tests, such as MRI.  How is this treated? Your health care provider will advise you on the best plan for treatment. Treatment may include:  Medicines.  Rehabilitation exercises.  Follow these instructions at home:  Follow proper lifting and walking techniques as advised by your health care provider.  Maintain good posture.  Exercise regularly as advised by your health care provider.  Perform relaxation exercises.  Change your sitting, standing, and sleeping habits as advised by your health care provider.  Change positions frequently.  Lose weight or maintain a healthy weight as advised by your health care provider.  Do not use any tobacco products, including cigarettes, chewing tobacco, or electronic cigarettes. If you need help quitting, ask your health care provider.  Wear supportive footwear.  Take medicines only as directed by your health care provider. Contact a health care provider if:  Your pain does not go away within 1-4 weeks.  You have significant appetite or weight loss. Get help right away if:  Your pain is severe.  You notice weakness in your arms, hands, or legs.  You begin to lose control of your bladder or bowel movements.  You have   fevers or night sweats. This information is not intended to replace advice given to you by your health care provider. Make sure you discuss any questions you have with your health care provider. Document Released: 02/16/2007 Document Revised: 09/27/2015 Document Reviewed: 08/23/2013 Elsevier Interactive Patient Education  2018 [employer].     IF you received an x-ray today, you will receive an invoice from [facility]. Please contact [facility]  Radiology at [phone] with questions or concerns regarding your invoice.   IF you received labwork today, you will receive an invoice from [facility]. Please contact [facility] at [phone] with questions or concerns regarding your invoice.   Our billing staff will not be able to assist you with questions regarding bills from these companies.  You will be contacted with the lab results as soon as they are available. The fastest way to get your results is to activate your My Chart account. Instructions are located on the last page of this paperwork. If you have not heard from [place] regarding the results in 2 weeks, please contact this office.

## 2017-05-08 NOTE — Progress Notes (Signed)
  MRN: [id number] DOB: Oct 07, 1965  Subjective:   Kimberly Macdonald is a 52 y.o. female presenting for >2 month history of low back pain, pain over her entire left leg. Pain is sharp, severe, worse at night and when she lays on her left side. Has used ibuprofen and Motrin. Denies fever, falls, trauma, weakness, incontinence. Denies history of back issues, sciatica. She is concerned about clot, denies recent long distance travel, surgeries, hospitalizations. She is not on contraception.  Kimberly Macdonald has a current medication list which includes the following prescription(s): b complex vitamins, co-enzyme q-10, l-arginine, levothyroxine, metoprolol succinate, and multivitamin. Also is allergic to azo [phenazopyridine hcl]; flagyl [metronidazole]; nitrofurantoin monohyd macro; lodine [etodolac]; and macrolides and ketolides.  Kimberly Macdonald  has a past medical history of Acne, Depression, History of palpitations, Hypertension, Hypothyroidism, and Thyroid disease. Also  has a past surgical history that includes Appendectomy (1994); Shoulder surgery; Tonsilectomy, adenoidectomy, bilateral myringotomy and tubes; Breast enhancement surgery; Cardiometabolic Testing (22/9798); and transthoracic echocardiogram (03/2005).  Objective:   Vitals: BP 131/83   Pulse (!) 116   Temp 98.4 F (36.9 C) (Oral)   Resp 18   Ht 5\' 3"  (1.6 m)   Wt 132 lb 3.2 oz (60 kg)   SpO2 100%   BMI 23.42 kg/m   Physical Exam  Constitutional: She is oriented to person, place, and time. She appears well-developed and well-nourished.  Cardiovascular: Normal rate.  Pulmonary/Chest: Effort normal.  Musculoskeletal:       Lumbar back: She exhibits normal range of motion, no tenderness, no bony tenderness, no swelling, no edema, no deformity, no laceration and no spasm.       Left upper leg: She exhibits no tenderness, no bony tenderness, no swelling, no edema, no deformity and no laceration.       Left lower leg: She exhibits no tenderness, no  bony tenderness, no swelling, no edema, no deformity and no laceration.  Negative SLR.  Neurological: She is alert and oriented to person, place, and time. She displays normal reflexes.  Skin: Skin is warm and dry.   Dg Lumbar Spine Complete  Result Date: 05/08/2017 CLINICAL DATA:  52 y/o  F; lower back pain with radiculopathy. EXAM: LUMBAR SPINE - COMPLETE 4+ VIEW COMPARISON:  None. FINDINGS: Partial sacralization of L5 vertebral body with lateral process articulation to the sacral ala. Surgical clips project over right upper and right lower quadrants. Mild lumbar levocurvature with apex at L2. Mild L5 S1 discogenic degenerative changes are stable with loss of disc space height. Normal lumbar lordosis without listhesis. IMPRESSION: 1. No acute fracture or dislocation. 2. Mild lumbar levocurvature. Partial sacralization of L5 vertebral body. Mild discogenic degenerative change at L5-S1. Electronically Signed   By: Kristine Garbe M.D.   On: 05/08/2017 18:16    Assessment and Plan :   Chronic left-sided low back pain without sciatica - Plan: DG Lumbar Spine Complete  Left leg pain - Plan: DG Lumbar Spine Complete  DDD (degenerative disc disease), lumbosacral   Will manage as an inflammatory process. Start prednisone, use trazodone for sleep. Hydrate well. If there is no improvement will refer to ortho. Return-to-clinic precautions discussed, patient verbalized understanding.   Jaynee Eagles, PA-C Primary Care at [place] [phone] 05/08/2017  5:27 PM

## 2017-06-22 DIAGNOSIS — Z6825 Body mass index (BMI) 25.0-25.9, adult: Secondary | ICD-10-CM | POA: Diagnosis not present

## 2017-06-22 DIAGNOSIS — Z130 Encounter for screening for diseases of the blood and blood-forming organs and certain disorders involving the immune mechanism: Secondary | ICD-10-CM | POA: Diagnosis not present

## 2017-06-22 DIAGNOSIS — Z01419 Encounter for gynecological examination (general) (routine) without abnormal findings: Secondary | ICD-10-CM | POA: Diagnosis not present

## 2017-06-22 DIAGNOSIS — Z13220 Encounter for screening for lipoid disorders: Secondary | ICD-10-CM | POA: Diagnosis not present

## 2017-06-22 DIAGNOSIS — E039 Hypothyroidism, unspecified: Secondary | ICD-10-CM | POA: Diagnosis not present

## 2017-06-22 DIAGNOSIS — Z1231 Encounter for screening mammogram for malignant neoplasm of breast: Secondary | ICD-10-CM | POA: Diagnosis not present

## 2017-06-22 DIAGNOSIS — Z0000 Encounter for general adult medical examination without abnormal findings: Secondary | ICD-10-CM | POA: Diagnosis not present

## 2017-11-15 ENCOUNTER — Other Ambulatory Visit: Payer: Self-pay | Admitting: Family Medicine

## 2017-11-15 DIAGNOSIS — I10 Essential (primary) hypertension: Secondary | ICD-10-CM

## 2017-12-17 ENCOUNTER — Other Ambulatory Visit: Payer: Self-pay | Admitting: Family Medicine

## 2017-12-17 DIAGNOSIS — I10 Essential (primary) hypertension: Secondary | ICD-10-CM

## 2017-12-17 NOTE — Telephone Encounter (Signed)
Metoprolol 50 mg 24 hr tablet refill Last Refill:11/16/17 # 30 Last OV: 11/03/16 with Brigitte Pulse for refills PCP: [facility] [address] [place]

## 2017-12-22 ENCOUNTER — Encounter: Payer: Self-pay | Admitting: Physician Assistant

## 2017-12-22 ENCOUNTER — Ambulatory Visit: Payer: BLUE CROSS/BLUE SHIELD | Admitting: Physician Assistant

## 2017-12-22 ENCOUNTER — Other Ambulatory Visit: Payer: Self-pay

## 2017-12-22 VITALS — BP 118/86 | HR 108 | Temp 98.4°F | Resp 18 | Ht 63.11 in | Wt 135.2 lb

## 2017-12-22 DIAGNOSIS — R000 Tachycardia, unspecified: Secondary | ICD-10-CM | POA: Diagnosis not present

## 2017-12-22 DIAGNOSIS — Z1211 Encounter for screening for malignant neoplasm of colon: Secondary | ICD-10-CM | POA: Diagnosis not present

## 2017-12-22 MED ORDER — METOPROLOL SUCCINATE ER 50 MG PO TB24: ORAL_TABLET | ORAL | 1 refills | Status: DC

## 2017-12-22 NOTE — Patient Instructions (Addendum)
Follow up with me for complete physical exam after you have lab work with gynecology. Bring your labs to that visit. It was a pleasure meeting you today.   IF you received an x-ray today, you will receive an invoice from [facility]. Please contact [facility] at [phone] with questions or concerns regarding your invoice.   IF you received labwork today, you will receive an invoice from [facility]. Please contact [facility] at [phone] with questions or concerns regarding your invoice.   Our billing staff will not be able to assist you with questions regarding bills from these companies.  You will be contacted with the lab results as soon as they are available. The fastest way to get your results is to activate your My Chart account. Instructions are located on the last page of this paperwork. If you have not heard from [place] regarding the results in 2 weeks, please contact this office.

## 2017-12-22 NOTE — Progress Notes (Signed)
     MRN: [id number] DOB: 1965/06/02  Subjective:   Kimberly Macdonald is a 52 y.o. female presenting for follow up on sinus tachycardia. Currently managed with metoprolol xl 50mg . Has been out for a couple of weeks.  Reports feeling well on it, will not feel any palpitations when she takes it daily. Denies lightheadedness, dizziness, chronic headache, double vision, chest pain, shortness of breath, heart racing, palpitations, nausea, vomiting, abdominal pain, hematuria, lower leg swelling. She exercises regularly.  Drinks one coffee per day. Denies illicit drug use and OTC med use.  Used to be followed by cardiology, Dr. Debara Pickett. He was the one who started her on this medication. Last saw him in 2014.   PMH of hypothyroidism: Denies any other aggravating or relieving factors, no other questions or concerns.  Kimberly Macdonald has a current medication list which includes the following prescription(s): b complex vitamins, co-enzyme q-10, l-arginine, levothyroxine, metoprolol succinate, multivitamin, prednisone, and trazodone. Also is allergic to azo [phenazopyridine hcl]; flagyl [metronidazole]; nitrofurantoin monohyd macro; lodine [etodolac]; and macrolides and ketolides.  Kimberly Macdonald  has a past medical history of Acne, Depression, History of palpitations, Hypertension, Hypothyroidism, and Thyroid disease. Also  has a past surgical history that includes Appendectomy (1994); Shoulder surgery; Tonsilectomy, adenoidectomy, bilateral myringotomy and tubes; Breast enhancement surgery; Cardiometabolic Testing (40/9811); and transthoracic echocardiogram (03/2005).   Objective:   Vitals: BP 118/86   Pulse (!) 108   Temp 98.4 F (36.9 C) (Oral)   Resp 18   Ht 5' 3.11" (1.603 m)   Wt 135 lb 3.2 oz (61.3 kg)   SpO2 99%   BMI 23.87 kg/m   Physical Exam  Constitutional: She is oriented to person, place, and time. She appears well-developed and well-nourished. No distress.  HENT:  Head: Normocephalic and atraumatic.    Eyes: Conjunctivae are normal.  Neck: Normal range of motion.  Cardiovascular: Regular rhythm, normal heart sounds and intact distal pulses. Tachycardia present.  Pulmonary/Chest: Effort normal.  Neurological: She is alert and oriented to person, place, and time.  Skin: Skin is warm and dry.  Psychiatric: She has a normal mood and affect.  Vitals reviewed.   No results found for this or any previous visit (from the past 24 hour(s)).  Assessment and Plan :  1. Tachycardia Refills provided.  Recommend following up with me for CPE in the next 4 to 6 weeks. - metoprolol succinate (TOPROL-XL) 50 MG 24 hr tablet; Take with or immediately following a meal.  Dispense: 90 tablet; Refill: 1  2. Screen for colon cancer - Ambulatory referral to Gastroenterology   Tenna Delaine, PA-C  Primary Care at [place] 12/22/2017 4:04 PM

## 2017-12-28 ENCOUNTER — Ambulatory Visit: Payer: BLUE CROSS/BLUE SHIELD | Admitting: Physician Assistant

## 2018-03-17 DIAGNOSIS — E663 Overweight: Secondary | ICD-10-CM | POA: Diagnosis not present

## 2018-03-17 DIAGNOSIS — Z6825 Body mass index (BMI) 25.0-25.9, adult: Secondary | ICD-10-CM | POA: Diagnosis not present

## 2018-03-25 DIAGNOSIS — H25041 Posterior subcapsular polar age-related cataract, right eye: Secondary | ICD-10-CM | POA: Diagnosis not present

## 2018-03-25 DIAGNOSIS — H43811 Vitreous degeneration, right eye: Secondary | ICD-10-CM | POA: Diagnosis not present

## 2018-04-08 ENCOUNTER — Encounter: Payer: Self-pay | Admitting: Physician Assistant

## 2018-04-26 DIAGNOSIS — R079 Chest pain, unspecified: Secondary | ICD-10-CM | POA: Diagnosis not present

## 2018-04-26 DIAGNOSIS — R52 Pain, unspecified: Secondary | ICD-10-CM | POA: Diagnosis not present

## 2018-04-26 DIAGNOSIS — R0789 Other chest pain: Secondary | ICD-10-CM | POA: Diagnosis not present

## 2018-06-17 DIAGNOSIS — Z01419 Encounter for gynecological examination (general) (routine) without abnormal findings: Secondary | ICD-10-CM | POA: Diagnosis not present

## 2018-06-17 DIAGNOSIS — Z1231 Encounter for screening mammogram for malignant neoplasm of breast: Secondary | ICD-10-CM | POA: Diagnosis not present

## 2018-06-17 DIAGNOSIS — Z113 Encounter for screening for infections with a predominantly sexual mode of transmission: Secondary | ICD-10-CM | POA: Diagnosis not present

## 2018-06-17 DIAGNOSIS — Z124 Encounter for screening for malignant neoplasm of cervix: Secondary | ICD-10-CM | POA: Diagnosis not present

## 2018-06-17 DIAGNOSIS — Z6825 Body mass index (BMI) 25.0-25.9, adult: Secondary | ICD-10-CM | POA: Diagnosis not present

## 2018-06-17 DIAGNOSIS — Z1151 Encounter for screening for human papillomavirus (HPV): Secondary | ICD-10-CM | POA: Diagnosis not present

## 2018-06-19 ENCOUNTER — Other Ambulatory Visit: Payer: Self-pay | Admitting: Physician Assistant

## 2018-06-19 DIAGNOSIS — R000 Tachycardia, unspecified: Secondary | ICD-10-CM

## 2018-07-22 ENCOUNTER — Other Ambulatory Visit: Payer: Self-pay | Admitting: Physician Assistant

## 2018-07-22 ENCOUNTER — Telehealth: Payer: Self-pay | Admitting: Family Medicine

## 2018-07-22 DIAGNOSIS — R000 Tachycardia, unspecified: Secondary | ICD-10-CM

## 2018-07-22 NOTE — Telephone Encounter (Signed)
Copied from [place] [phone]. Topic: General - Other >> Jul 22, 2018  1:03 PM Keene Breath wrote: Reason for CRM: Patient called to request a few months refills on her medication, metoprolol succinate (TOPROL-XL) 50 MG 24 hr tablet.  Patient stated that the pharmacy called and said that she would have to schedule an appointment before she could get an appointment.  Patient stated that she does not want to come into the office with sick patients and she does need this medication because it is for her heart.  Please advise and call back at [phone].

## 2018-07-22 NOTE — Telephone Encounter (Signed)
Pt needs an appointment for refill of metoprolol-when pt schedules an appointment please route back to clinical pool for enough medication to carry her through till her appt.  We refilled her medication last month and requested an appointment but none was made.  She will need to re-establish care since all providers that she had seen previously are no longer with the practice

## 2018-07-22 NOTE — Telephone Encounter (Signed)
Patient needs appointment / Prescription filled in February 2020 with not patient needs appointment

## 2018-08-06 ENCOUNTER — Telehealth: Payer: Self-pay | Admitting: Family Medicine

## 2018-08-06 ENCOUNTER — Other Ambulatory Visit: Payer: Self-pay

## 2018-08-06 ENCOUNTER — Other Ambulatory Visit: Payer: Self-pay | Admitting: Physician Assistant

## 2018-08-06 DIAGNOSIS — R000 Tachycardia, unspecified: Secondary | ICD-10-CM

## 2018-08-06 MED ORDER — METOPROLOL SUCCINATE ER 50 MG PO TB24: ORAL_TABLET | ORAL | 0 refills | Status: DC

## 2018-08-06 NOTE — Telephone Encounter (Signed)
Pt would like refill on her metoprolol succinate (TOPROL-XL) 50 MG 24 hr tablet [facility]acy:  [id number] [place], [place] - [address]. She has an upcoming app with [facility] tele[facility] on 08/09/18. Please advise at [phone]

## 2018-08-06 NOTE — Telephone Encounter (Signed)
Pt needs telemed for refills of medication however sent in 30 day supply but will not get another refill until seen.

## 2018-08-09 ENCOUNTER — Telehealth (INDEPENDENT_AMBULATORY_CARE_PROVIDER_SITE_OTHER): Payer: Self-pay | Admitting: Family Medicine

## 2018-08-09 ENCOUNTER — Other Ambulatory Visit: Payer: Self-pay

## 2018-08-09 DIAGNOSIS — Z1211 Encounter for screening for malignant neoplasm of colon: Secondary | ICD-10-CM

## 2018-08-09 DIAGNOSIS — R000 Tachycardia, unspecified: Secondary | ICD-10-CM

## 2018-08-09 DIAGNOSIS — Z760 Encounter for issue of repeat prescription: Secondary | ICD-10-CM

## 2018-08-09 DIAGNOSIS — I10 Essential (primary) hypertension: Secondary | ICD-10-CM

## 2018-08-09 MED ORDER — METOPROLOL SUCCINATE ER 50 MG PO TB24: ORAL_TABLET | ORAL | 6 refills | Status: DC

## 2018-08-09 NOTE — Progress Notes (Signed)
CC: medication refill on metoprolol.  No other concerns for dr Nolon Rod.  No travel outside the [place] or  [place] in the past 3 weeks.

## 2018-08-09 NOTE — Progress Notes (Signed)
Telemedicine Encounter- SOAP NOTE Established Patient  This telephone encounter was conducted with the patient's (or proxy's) verbal consent via audio telecommunications: yes/no: Yes Patient was instructed to have this encounter in a suitably private space; and to only have persons present to whom they give permission to participate. In addition, patient identity was confirmed by use of name plus two identifiers (DOB and address).  I discussed the limitations, risks, security and privacy concerns of performing an evaluation and management service by telephone and the availability of in person appointments. I also discussed with the patient that there may be a patient responsible charge related to this service. The patient expressed understanding and agreed to proceed.  I spent a total of TIME; 0 MIN TO 60 MIN: 15 minutes talking with the patient or their proxy.  CC: metoprolol refill  Subjective   Kimberly Macdonald is a 53 y.o. established patient. Telephone visit today for  HPI  Patient has a history of hypertension that is now controlled  She takes metoprolol for her tachycardia She states that her Gyne manages her thyroid She denies brachycardia She denies dizziness or fatigue  BP Readings from Last 3 Encounters:  12/22/17 118/86  05/08/17 131/83  11/03/16 110/71   Colon Cancer Screening She has never had a colonoscopy He denies blood in his stool, unexpected weight loss or pain with defecation She denies rectal itching she does not smoke she does not have a family history of colon cancer    Patient Active Problem List   Diagnosis Date Noted  . Essential hypertension 11/05/2016  . Hypothyroidism 11/05/2016  . Vertigo 08/28/2014  . Vestibular dizziness 03/08/2013  . Hearing loss 03/08/2013  . Chest heaviness 02/16/2013  . Sinus tachycardia 06/09/2011  . Tachycardia 05/17/2011  . Depression 05/17/2011    Past Medical History:  Diagnosis Date  . Acne   .  Depression   . History of palpitations   . Hypertension   . Hypothyroidism   . Thyroid disease    hypothyroidism    Current Outpatient Medications  Medication Sig Dispense Refill  . b complex vitamins tablet Take 1 tablet by mouth daily.    Marland Kitchen co-enzyme Q-10 30 MG capsule Take 300 mg by mouth daily.     Marland Kitchen L-Arginine 1000 MG TABS Take 9,000 mg by mouth.    . levothyroxine (SYNTHROID, LEVOTHROID) 88 MCG tablet Take 88 mcg by mouth daily before breakfast.    . metoprolol succinate (TOPROL-XL) 50 MG 24 hr tablet TAKE 1 TABLET BY MOUTH WITH OR IMMEDIATELY FOLLOWING A MEAL. 30 tablet 6  . Multiple Vitamin (MULTIVITAMIN) tablet Take 1 tablet by mouth daily.     No current facility-administered medications for this visit.     Allergies  Allergen Reactions  . Azo [Phenazopyridine Hcl] Hives  . Flagyl [Metronidazole] Hives  . Nitrofurantoin Monohyd Macro Rash    Flu like sxs  . Red Dye Hives  . Lodine [Etodolac]     "Advil is ok"  . Macrolides And Ketolides     Social History   Socioeconomic History  . Marital status: Married    Spouse name: Not on file  . Number of children: 3  . Years of education: 54yr coll.  . Highest education level: Not on file  Occupational History  . Not on file  Social Needs  . Financial resource strain: Not on file  . Food insecurity:    Worry: Not on file    Inability: Not on file  .  Transportation needs:    Medical: Not on file    Non-medical: Not on file  Tobacco Use  . Smoking status: Never Smoker  . Smokeless tobacco: Never Used  Substance and Sexual Activity  . Alcohol use: Yes    Comment: occasionally (twice yearly)  . Drug use: No  . Sexual activity: Yes  Lifestyle  . Physical activity:    Days per week: Not on file    Minutes per session: Not on file  . Stress: Not on file  Relationships  . Social connections:    Talks on phone: Not on file    Gets together: Not on file    Attends religious service: Not on file    Active  member of club or organization: Not on file    Attends meetings of clubs or organizations: Not on file    Relationship status: Not on file  . Intimate partner violence:    Fear of current or ex partner: Not on file    Emotionally abused: Not on file    Physically abused: Not on file    Forced sexual activity: Not on file  Other Topics Concern  . Not on file  Social History Narrative  . Not on file    ROS Review of Systems  Constitutional: Negative for activity change, appetite change, chills and fever.  HENT: Negative for congestion, nosebleeds, trouble swallowing and voice change.   Respiratory: Negative for cough, shortness of breath and wheezing.   Gastrointestinal: Negative for diarrhea, nausea and vomiting.  Genitourinary: Negative for difficulty urinating, dysuria, flank pain and hematuria.  Musculoskeletal: Negative for back pain, joint swelling and neck pain.  Neurological: Negative for dizziness, speech difficulty, light-headedness and numbness.  See HPI. All other review of systems negative.   Objective   Vitals as reported by the patient: There were no vitals filed for this visit.  Diagnoses and all orders for this visit:  Special screening for malignant neoplasms, colon- discussed options Pt elected cologuard -     Cologuard  Tachycardia- stable with metoprolol -     metoprolol succinate (TOPROL-XL) 50 MG 24 hr tablet; TAKE 1 TABLET BY MOUTH WITH OR IMMEDIATELY FOLLOWING A MEAL.  Encounter for medication refill  Essential hypertension- stable, continue metoprolol for tachycardia Cpm, DASH diet     I discussed the assessment and treatment plan with the patient. The patient was provided an opportunity to ask questions and all were answered. The patient agreed with the plan and demonstrated an understanding of the instructions.   The patient was advised to call back or seek an in-person evaluation if the symptoms worsen or if the condition fails to improve as  anticipated.  I provided 15 minutes of non-face-to-face time during this encounter.  Forrest Moron, MD  Primary Care at [facility]

## 2018-08-09 NOTE — Patient Instructions (Signed)
° ° ° °  If you have lab work done today you will be contacted with your lab results within the next 2 weeks.  If you have not heard from us then please contact us. The fastest way to get your results is to register for My Chart. ° ° °IF you received an x-ray today, you will receive an invoice from [facility]. Please contact [facility] at [phone] with questions or concerns regarding your invoice.  ° °IF you received labwork today, you will receive an invoice from [facility]. Please contact [facility] at [phone] with questions or concerns regarding your invoice.  ° °Our billing staff will not be able to assist you with questions regarding bills from these companies. ° °You will be contacted with the lab results as soon as they are available. The fastest way to get your results is to activate your My Chart account. Instructions are located on the last page of this paperwork. If you have not heard from us regarding the results in 2 weeks, please contact this office. °  ° ° ° °

## 2018-08-12 ENCOUNTER — Telehealth: Payer: Self-pay | Admitting: Family Medicine

## 2018-08-12 NOTE — Telephone Encounter (Signed)
Copied from [place] [id number]. Topic: General - Inquiry >> Aug 12, 2018  1:08 PM Vernona Rieger wrote: Reason for CRM: Patient said her daughter, Mollie Germany keeps getting bills sent to her address, reminder calls sent to her phone, and she just received a call about getting her colo-guard set up that Dr Nolon Rod just set up for her with the referral. She said this has been going on for years and she has asked several times to have this fixed. Her daughter's number is [phone] which I removed from her chart. Please Advise. Also, she needs someone to call her to set up the colo-guard and not her daughter

## 2018-08-25 ENCOUNTER — Ambulatory Visit: Payer: Self-pay

## 2018-08-25 NOTE — Telephone Encounter (Signed)
Pt called and said that she just removed a tick from her left shoulder. She states that the tick was probably there for 2 days.  She had been out 2 days ago doing yard work.  She states that her daughter did not think that the head had come out with the tick.  She states it was in deep. She describes the tick as a small black dot. She has had no redness to the area. No headache, rash or fever. Care advice read to patient. Call transferred to office for scheduling.  Reason for Disposition . Can't remove tick's head that was broken off in the skin (after trying Care Advice)  Answer Assessment - Initial Assessment Questions 1. TYPE of TICK: "Is it a wood tick or a deer tick?" If unsure, ask: "What size was the tick?" "Did it look more like a watermelon seed or a poppy seed?"      Deer tick 2. LOCATION: "Where is the tick bite located?"     Left shoulder 3. ONSET: "How long do you think the tick was attached before you removed it?" (Hours or days)      2 days 4. TETANUS: "When was the last tetanus booster?"      Yes within a few yesrs 5. PREGNANCY: "Is there any chance you are pregnant?" "When was your last menstrual period?"    No  Protocols used: TICK BITE-A-AH

## 2018-08-26 ENCOUNTER — Telehealth: Payer: Self-pay | Admitting: Family Medicine

## 2018-08-26 ENCOUNTER — Other Ambulatory Visit: Payer: Self-pay

## 2018-08-26 ENCOUNTER — Ambulatory Visit (INDEPENDENT_AMBULATORY_CARE_PROVIDER_SITE_OTHER): Payer: BLUE CROSS/BLUE SHIELD | Admitting: Family Medicine

## 2018-08-26 ENCOUNTER — Encounter: Payer: Self-pay | Admitting: Family Medicine

## 2018-08-26 VITALS — BP 108/64 | HR 83 | Temp 98.7°F | Ht 62.0 in | Wt 137.4 lb

## 2018-08-26 DIAGNOSIS — W57XXXA Bitten or stung by nonvenomous insect and other nonvenomous arthropods, initial encounter: Secondary | ICD-10-CM | POA: Diagnosis not present

## 2018-08-26 DIAGNOSIS — S40262A Insect bite (nonvenomous) of left shoulder, initial encounter: Secondary | ICD-10-CM | POA: Diagnosis not present

## 2018-08-26 MED ORDER — DOXYCYCLINE HYCLATE 100 MG PO TABS: ORAL_TABLET | ORAL | 0 refills | Status: DC

## 2018-08-26 NOTE — Telephone Encounter (Signed)
Fyi to provider.  She is on your schedule today.

## 2018-08-26 NOTE — Telephone Encounter (Signed)
rx corrected, no follow up needed

## 2018-08-26 NOTE — Telephone Encounter (Signed)
Copied from [place] [id number]. Topic: Quick Communication - See Telephone Encounter >> Aug 26, 2018  3:14 PM Blase Mess A wrote: CRM for notification. See Telephone encounter for: 08/26/18. Patient is calling on behalf of her pharmacy regarding doxycycline (VIBRA-TABS) 100 MG tablet [[id number]] stating that the quantity is incorrect. Please advise [id number] - [place], [place] (Phone) [phone] (Fax)

## 2018-08-26 NOTE — Progress Notes (Signed)
Acute Office Visit  Subjective:    Patient ID: Kimberly Macdonald, female    DOB: Aug 29, 1965, 53 y.o.   MRN: [id number]  Chief Complaint  Patient presents with  . deer tick    head still might be in skin. going on 3 days now    HPI Patient is in today for possible tick remains still under skin. Daughter attempted to pull out and pt concerned still head remaining. Pt with no pain, no redness. Unclear when tick acquired-mowed on Sat and did not notice till Tuesday.  Past Medical History:  Diagnosis Date  . Acne   . Depression   . History of palpitations   . Hypertension   . Hypothyroidism   . Thyroid disease    hypothyroidism    Past Surgical History:  Procedure Laterality Date  . APPENDECTOMY  1994  . BREAST ENHANCEMENT SURGERY    . Cardiometabolic Testing  60/1093   normal effort with peak VO2 of 73% predicted, HR accelerated at 97% predcited, HR on VO2 curve showed steep invline at anaerobic threshold - low risk, abnormal  . SHOULDER SURGERY    . TONSILECTOMY, ADENOIDECTOMY, BILATERAL MYRINGOTOMY AND TUBES    . TRANSTHORACIC ECHOCARDIOGRAM  03/2005   EF 55-65%, normal LV systolic function - no LV regional wall motion abnormalities    Family History  Problem Relation Age of Onset  . Hypertension Mother        diverticulitis  . Hyperlipidemia Father        HTN  . Depression Sister   . Breast cancer Maternal Grandmother   . Heart disease Paternal Grandmother        MI   . Heart disease Paternal Grandfather        MI    Social History   Socioeconomic History  . Marital status: Married    Spouse name: Not on file  . Number of children: 3  . Years of education: 2yr coll.  . Highest education level: Not on file  Occupational History  . Not on file  Social Needs  . Financial resource strain: Not on file  . Food insecurity:    Worry: Not on file    Inability: Not on file  . Transportation needs:    Medical: Not on file    Non-medical: Not on file   Tobacco Use  . Smoking status: Never Smoker  . Smokeless tobacco: Never Used  Substance and Sexual Activity  . Alcohol use: Yes    Comment: occasionally (twice yearly)  . Drug use: No  . Sexual activity: Yes  Lifestyle  . Physical activity:    Days per week: Not on file    Minutes per session: Not on file  . Stress: Not on file  Relationships  . Social connections:    Talks on phone: Not on file    Gets together: Not on file    Attends religious service: Not on file    Active member of club or organization: Not on file    Attends meetings of clubs or organizations: Not on file    Relationship status: Not on file  . Intimate partner violence:    Fear of current or ex partner: Not on file    Emotionally abused: Not on file    Physically abused: Not on file    Forced sexual activity: Not on file  Other Topics Concern  . Not on file  Social History Narrative  . Not on file  Outpatient Medications Prior to Visit  Medication Sig Dispense Refill  . b complex vitamins tablet Take 1 tablet by mouth daily.    Marland Kitchen co-enzyme Q-10 30 MG capsule Take 300 mg by mouth daily.     Marland Kitchen L-Arginine 1000 MG TABS Take 9,000 mg by mouth.    . levothyroxine (SYNTHROID, LEVOTHROID) 88 MCG tablet Take 88 mcg by mouth daily before breakfast.    . metoprolol succinate (TOPROL-XL) 50 MG 24 hr tablet TAKE 1 TABLET BY MOUTH WITH OR IMMEDIATELY FOLLOWING A MEAL. 30 tablet 6  . Multiple Vitamin (MULTIVITAMIN) tablet Take 1 tablet by mouth daily.    . phentermine (ADIPEX-P) 37.5 MG tablet TK 1 T PO D    . valACYclovir (VALTREX) 500 MG tablet TK 1 T PO BID UTD     No facility-administered medications prior to visit.     Allergies  Allergen Reactions  . Azo [Phenazopyridine Hcl] Hives  . Flagyl [Metronidazole] Hives  . Nitrofurantoin Monohyd Macro Rash    Flu like sxs  . Red Dye Hives  . Lodine [Etodolac]     "Advil is ok"  . Macrolides And Ketolides     ROS CONSTITUTIONAL: no  Fever MS:no  joint pain INTEG: NO persistent rash, NO itching, NO new skin lesion     Objective:    Physical Exam  Constitutional: She appears well-developed and well-nourished.  Skin: No rash noted. No erythema.  site of tick on the left posterior shoulder revealed no evident foreign body  BP 108/64 (BP Location: Right Arm, Patient Position: Sitting, Cuff Size: Normal)   Pulse 83   Temp 98.7 F (37.1 C) (Oral)   Ht 5\' 2"  (1.575 m)   Wt 137 lb 6.4 oz (62.3 kg)   SpO2 99%   BMI 25.13 kg/m  Wt Readings from Last 3 Encounters:  08/26/18 137 lb 6.4 oz (62.3 kg)  12/22/17 135 lb 3.2 oz (61.3 kg)  05/08/17 132 lb 3.2 oz (60 kg)    Health Maintenance Due  Topic Date Due  . HIV Screening  02/14/1982  . PAP SMEAR-Modifier  02/15/1988  . MAMMOGRAM  02/14/2017  . COLONOSCOPY  02/14/2017    There are no preventive care reminders to display for this patient.   Lab Results  Component Value Date   TSH 0.410 08/28/2014   Lab Results  Component Value Date   WBC 6.5 08/28/2014   HGB 14.0 08/28/2014   HCT 41.8 08/28/2014   MCV 90.2 08/28/2014   PLT 291 06/09/2011   Lab Results  Component Value Date   NA 140 08/28/2014   K 4.2 08/28/2014   CO2 27 08/28/2014   GLUCOSE 98 08/28/2014   BUN 20 08/28/2014   CREATININE 0.70 08/28/2014   BILITOT 0.4 08/28/2014   ALKPHOS 79 08/28/2014   AST 30 08/28/2014   ALT 41 (H) 08/28/2014   PROT 6.9 08/28/2014   ALBUMIN 4.2 08/28/2014   CALCIUM 9.8 08/28/2014   Lab Results  Component Value Date   CHOL 192 06/09/2011   Lab Results  Component Value Date   HDL 61 06/09/2011   Lab Results  Component Value Date   LDLCALC 91 06/09/2011   Lab Results  Component Value Date   TRIG 198 (H) 06/09/2011   Lab Results  Component Value Date   CHOLHDL 3.1 06/09/2011   No results found for: HGBA1C     Assessment & Plan:    1. Tick bite of left scapular region, initial encounter D/w pt  risk/benefit/side effects/cdc recommendations-doxy-rx    Meds ordered this encounter  Medications  . doxycycline (VIBRA-TABS) 100 MG tablet    Sig: Take 2 po-one time dose    Dispense:  20 tablet    Refill:  0    Avoid red dye-use formulation with no red dye as this causes hives     Shemiah Rosch Hannah Beat, MD

## 2018-08-26 NOTE — Patient Instructions (Signed)
° ° ° °  If you have lab work done today you will be contacted with your lab results within the next 2 weeks.  If you have not heard from us then please contact us. The fastest way to get your results is to register for My Chart. ° ° °IF you received an x-ray today, you will receive an invoice from [facility]. Please contact [facility] at [phone] with questions or concerns regarding your invoice.  ° °IF you received labwork today, you will receive an invoice from [facility]. Please contact [facility] at [phone] with questions or concerns regarding your invoice.  ° °Our billing staff will not be able to assist you with questions regarding bills from these companies. ° °You will be contacted with the lab results as soon as they are available. The fastest way to get your results is to activate your My Chart account. Instructions are located on the last page of this paperwork. If you have not heard from us regarding the results in 2 weeks, please contact this office. °  ° ° ° °

## 2018-08-27 ENCOUNTER — Encounter: Payer: Self-pay | Admitting: Family Medicine

## 2018-08-27 DIAGNOSIS — W57XXXA Bitten or stung by nonvenomous insect and other nonvenomous arthropods, initial encounter: Secondary | ICD-10-CM | POA: Insufficient documentation

## 2018-08-27 DIAGNOSIS — S40262A Insect bite (nonvenomous) of left shoulder, initial encounter: Secondary | ICD-10-CM | POA: Insufficient documentation

## 2018-08-30 NOTE — Telephone Encounter (Signed)
Can we work with the clerical and clinical team on this? Clerical team: How do we update her demographics so this is no longer a problems?  Clinical team: Can verify where the cologuard kit was sent and update the patient's records appropriately?

## 2018-09-07 DIAGNOSIS — Z1211 Encounter for screening for malignant neoplasm of colon: Secondary | ICD-10-CM | POA: Diagnosis not present

## 2018-09-09 NOTE — Telephone Encounter (Signed)
We've verified that the Cologuard was sent with the correct address.  Is there any update from the clerical side on the demographics for billing issues?

## 2018-09-14 LAB — COLOGUARD: Cologuard: NEGATIVE

## 2018-09-28 ENCOUNTER — Telehealth: Payer: Self-pay

## 2018-09-28 ENCOUNTER — Encounter: Payer: Self-pay | Admitting: Family Medicine

## 2018-09-28 DIAGNOSIS — H25041 Posterior subcapsular polar age-related cataract, right eye: Secondary | ICD-10-CM | POA: Diagnosis not present

## 2018-09-28 NOTE — Telephone Encounter (Signed)
Sent cologuard results via mail to pt home address. Dgaddy, CMA

## 2018-12-06 DIAGNOSIS — Z79899 Other long term (current) drug therapy: Secondary | ICD-10-CM | POA: Diagnosis not present

## 2018-12-06 DIAGNOSIS — E663 Overweight: Secondary | ICD-10-CM | POA: Diagnosis not present

## 2018-12-06 DIAGNOSIS — Z6828 Body mass index (BMI) 28.0-28.9, adult: Secondary | ICD-10-CM | POA: Diagnosis not present

## 2019-01-20 ENCOUNTER — Other Ambulatory Visit: Payer: Self-pay

## 2019-01-20 ENCOUNTER — Ambulatory Visit (INDEPENDENT_AMBULATORY_CARE_PROVIDER_SITE_OTHER): Payer: BC Managed Care – PPO

## 2019-01-20 ENCOUNTER — Encounter: Payer: Self-pay | Admitting: Family Medicine

## 2019-01-20 ENCOUNTER — Ambulatory Visit: Payer: BC Managed Care – PPO | Admitting: Family Medicine

## 2019-01-20 VITALS — BP 96/64 | HR 83 | Temp 97.7°F | Resp 18 | Ht 62.0 in | Wt 131.8 lb

## 2019-01-20 DIAGNOSIS — K625 Hemorrhage of anus and rectum: Secondary | ICD-10-CM

## 2019-01-20 DIAGNOSIS — K6289 Other specified diseases of anus and rectum: Secondary | ICD-10-CM | POA: Diagnosis not present

## 2019-01-20 DIAGNOSIS — I9589 Other hypotension: Secondary | ICD-10-CM

## 2019-01-20 DIAGNOSIS — R1032 Left lower quadrant pain: Secondary | ICD-10-CM

## 2019-01-20 DIAGNOSIS — R197 Diarrhea, unspecified: Secondary | ICD-10-CM

## 2019-01-20 DIAGNOSIS — R6889 Other general symptoms and signs: Secondary | ICD-10-CM | POA: Diagnosis not present

## 2019-01-20 DIAGNOSIS — E861 Hypovolemia: Secondary | ICD-10-CM

## 2019-01-20 DIAGNOSIS — K921 Melena: Secondary | ICD-10-CM | POA: Diagnosis not present

## 2019-01-20 LAB — POCT CBC
Granulocyte percent: 67 %G (ref 37–80)
HCT, POC: 39.7 % (ref 29–41)
Hemoglobin: 13.4 g/dL (ref 11–14.6)
Lymph, poc: 1.8 (ref 0.6–3.4)
MCH, POC: 28.7 pg (ref 27–31.2)
MCHC: 33.8 g/dL (ref 31.8–35.4)
MCV: 85.1 fL (ref 76–111)
MID (cbc): 0.4 (ref 0–0.9)
MPV: 6.1 fL (ref 0–99.8)
POC Granulocyte: 4.5 (ref 2–6.9)
POC LYMPH PERCENT: 26.3 %L (ref 10–50)
POC MID %: 6.7 %M (ref 0–12)
Platelet Count, POC: 370 10*3/uL (ref 142–424)
RBC: 4.67 M/uL (ref 4.04–5.48)
RDW, POC: 13.3 %
WBC: 6.7 10*3/uL (ref 4.6–10.2)

## 2019-01-20 LAB — POC HEMOCCULT BLD/STL (OFFICE/1-CARD/DIAGNOSTIC): Fecal Occult Blood, POC: NEGATIVE

## 2019-01-20 NOTE — Progress Notes (Addendum)
9/17/20202:14 PM  Kimberly Macdonald January 21, 1966, 53 y.o., female [id number]  Chief Complaint  Patient presents with  . Rectal Bleeding    started 5-6 weeks, bad diarrhea and abd pain, no meds were taken    HPI:   Patient is a 53 y.o. female with past medical history significant for HTN, hypothyroidism who presents today for rectal bleeding  For the past 6 weeks she has been having alternating severe diarrhea and constipation Having lower low back cramping Has hemorrhoids, they flared up, tender, bleeding with increase over the past several days, hemorrhoid has disappeared, she is concerned it has burst  Usually her BMS are normal No fever or chills No nausea or vomiting, abd pain just when she is constipated, lower abd feels very heavy Denies any pelvic organ prolapse Has not been eating or drinking much for fear of diarrhea Denies any dizziness, headaches, presyncope She does feel very fatigued Her tachycardia has not been worse No chest pain or SOB Having random sharp pain left flank  Normal cologuard may 2020 Ct abd and pelvic 2010 - diverticulosis   Depression screen [facility] 2/9 01/20/2019 08/26/2018 08/09/2018  Decreased Interest 0 0 0  Down, Depressed, Hopeless 0 0 0  PHQ - 2 Score 0 0 0  Altered sleeping 0 - -  Tired, decreased energy 0 - -  Change in appetite 0 - -  Feeling bad or failure about yourself  0 - -  Trouble concentrating 0 - -  Moving slowly or fidgety/restless 0 - -  Suicidal thoughts 0 - -  PHQ-9 Score 0 - -  Difficult doing work/chores Not difficult at all - -    Fall Risk  08/26/2018 08/09/2018 12/22/2017 11/03/2016  Falls in the past year? 0 0 No No  Number falls in past yr: 0 0 - -  Injury with Fall? 0 0 - -  Follow up - Falls evaluation completed - -     Allergies  Allergen Reactions  . Azo [Phenazopyridine Hcl] Hives  . Flagyl [Metronidazole] Hives  . Nitrofurantoin Monohyd Macro Rash    Flu like sxs  . Red Dye Hives  . Lodine [Etodolac]      "Advil is ok"  . Macrolides And Ketolides     Prior to Admission medications   Medication Sig Start Date End Date Taking? Authorizing Provider  b complex vitamins tablet Take 1 tablet by mouth daily.   Yes [provider]  co-enzyme Q-10 30 MG capsule Take 300 mg by mouth daily.    Yes [provider]  L-Arginine 1000 MG TABS Take 9,000 mg by mouth.   Yes [provider]  levothyroxine (SYNTHROID, LEVOTHROID) 88 MCG tablet Take 88 mcg by mouth daily before breakfast.   Yes [provider]  metoprolol succinate (TOPROL-XL) 50 MG 24 hr tablet TAKE 1 TABLET BY MOUTH WITH OR IMMEDIATELY FOLLOWING A MEAL. 08/09/18  Yes Stallings, Zoe A, MD  Multiple Vitamin (MULTIVITAMIN) tablet Take 1 tablet by mouth daily.   Yes [provider]  valACYclovir (VALTREX) 500 MG tablet TK 1 T PO BID UTD 06/17/18  Yes [provider]    Past Medical History:  Diagnosis Date  . Acne   . Depression   . History of palpitations   . Hypertension   . Hypothyroidism   . Thyroid disease    hypothyroidism    Past Surgical History:  Procedure Laterality Date  . APPENDECTOMY  1994  . BREAST ENHANCEMENT SURGERY    .  Cardiometabolic Testing  0000000   normal effort with peak VO2 of 73% predicted, HR accelerated at 97% predcited, HR on VO2 curve showed steep invline at anaerobic threshold - low risk, abnormal  . SHOULDER SURGERY    . TONSILECTOMY, ADENOIDECTOMY, BILATERAL MYRINGOTOMY AND TUBES    . TRANSTHORACIC ECHOCARDIOGRAM  03/2005   EF 55-65%, normal LV systolic function - no LV regional wall motion abnormalities    Social History   Tobacco Use  . Smoking status: Never Smoker  . Smokeless tobacco: Never Used  Substance Use Topics  . Alcohol use: Yes    Comment: occasionally (twice yearly)    Family History  Problem Relation Age of Onset  . Hypertension Mother        diverticulitis  . Hyperlipidemia Father        HTN  . Depression Sister   .  Breast cancer Maternal Grandmother   . Heart disease Paternal Grandmother        MI   . Heart disease Paternal Grandfather        MI    ROS Per hpi  OBJECTIVE:  Today's Vitals   01/20/19 1403 01/20/19 1516  BP: (!) 91/51 96/64  Pulse: 83   Resp: 18   Temp: 97.7 F (36.5 C)   TempSrc: Oral   SpO2: 98%   Weight: 131 lb 12.8 oz (59.8 kg)   Height: 5\' 2"  (1.575 m)    Body mass index is 24.11 kg/m.  BP Readings from Last 3 Encounters:  01/20/19 (!) 91/51  08/26/18 108/64  12/22/17 118/86   Wt Readings from Last 3 Encounters:  01/20/19 131 lb 12.8 oz (59.8 kg)  08/26/18 137 lb 6.4 oz (62.3 kg)  12/22/17 135 lb 3.2 oz (61.3 kg)    Physical Exam Vitals signs and nursing note reviewed. Exam conducted with a chaperone present.  Constitutional:      Appearance: She is well-developed.  HENT:     Head: Normocephalic and atraumatic.     Mouth/Throat:     Pharynx: No oropharyngeal exudate.  Eyes:     General: No scleral icterus.    Conjunctiva/sclera: Conjunctivae normal.     Pupils: Pupils are equal, round, and reactive to light.  Neck:     Musculoskeletal: Neck supple.  Cardiovascular:     Rate and Rhythm: Normal rate and regular rhythm.     Heart sounds: Normal heart sounds. No murmur. No friction rub. No gallop.   Pulmonary:     Effort: Pulmonary effort is normal.     Breath sounds: Normal breath sounds. No wheezing or rales.  Abdominal:     General: Bowel sounds are normal. There is no distension.     Palpations: Abdomen is soft. There is no hepatomegaly, splenomegaly or mass.     Tenderness: There is no abdominal tenderness.     Hernia: No hernia is present.     Comments: RLQ fullness, non tender, ? Stool?   Genitourinary:    Rectum: Tenderness (area of firmness and tenderness along posterior wall) present. No anal fissure, external hemorrhoid or internal hemorrhoid. Normal anal tone.  Skin:    General: Skin is warm and dry.  Neurological:     Mental  Status: She is alert and oriented to person, place, and time.     Results for orders placed or performed in visit on 01/20/19 (from the past 24 hour(s))  POCT CBC     Status: None   Collection Time: 01/20/19  2:46 PM  Result Value Ref Range   WBC 6.7 4.6 - 10.2 K/uL   Lymph, poc 1.8 0.6 - 3.4   POC LYMPH PERCENT 26.3 10 - 50 %L   MID (cbc) 0.4 0 - 0.9   POC MID % 6.7 0 - 12 %M   POC Granulocyte 4.5 2 - 6.9   Granulocyte percent 67.0 37 - 80 %G   RBC 4.67 4.04 - 5.48 M/uL   Hemoglobin 13.4 11 - 14.6 g/dL   HCT, POC 39.7 29 - 41 %   MCV 85.1 76 - 111 fL   MCH, POC 28.7 27 - 31.2 pg   MCHC 33.8 31.8 - 35.4 g/dL   RDW, POC 13.3 %   Platelet Count, POC 370 142 - 424 K/uL   MPV 6.1 0 - 99.8 fL  POC Hemoccult Bld/Stl (1-Cd Office Dx)     Status: None   Collection Time: 01/20/19  2:50 PM  Result Value Ref Range   Card #1 Date 01/20/19    Fecal Occult Blood, POC Negative Negative    Dg Abd 2 Views  Result Date: 01/20/2019 CLINICAL DATA:  Bloody diarrhea for 6 weeks. EXAM: ABDOMEN - 2 VIEW COMPARISON:  None. FINDINGS: The bowel gas pattern is normal. There is no evidence of free air. No radio-opaque calculi or other significant radiographic abnormality is seen. IMPRESSION: Negative. Electronically Signed   By: Dorise Bullion III M.D   On: 01/20/2019 14:56    ASSESSMENT and PLAN  1. Intermittent diarrhea Labs and studies pending. Cont to push fluids and maintain hydration. RTC/ER precautions reviewed. Consider GI referral. - POCT CBC - Comprehensive metabolic panel - DG Abd 2 Views; Future - CT Angio Abd/Pel w/ and/or w/o; Future  2. Bright red blood per rectum - POCT CBC - Comprehensive metabolic panel - DG Abd 2 Views; Future - Care order/instruction: - POC Hemoccult Bld/Stl (1-Cd Office Dx) - CT Angio Abd/Pel w/ and/or w/o; Future  3. Rectal pain - CT Angio Abd/Pel w/ and/or w/o; Future  4. Abnormal digital rectal exam - CT Abd/Pel w/ and/or w/o; Future  5.  Hypotension due to hypovolemia  Return in about 2 weeks (around 02/03/2019).    Rutherford Guys, MD [facility] [place],  [address] Ph.  [phone] Fax [phone]

## 2019-01-21 LAB — COMPREHENSIVE METABOLIC PANEL
ALT: 16 IU/L (ref 0–32)
AST: 22 IU/L (ref 0–40)
Albumin/Globulin Ratio: 1.8 (ref 1.2–2.2)
Albumin: 4.4 g/dL (ref 3.8–4.9)
Alkaline Phosphatase: 93 IU/L (ref 39–117)
BUN/Creatinine Ratio: 15 (ref 9–23)
BUN: 12 mg/dL (ref 6–24)
Bilirubin Total: 0.6 mg/dL (ref 0.0–1.2)
CO2: 27 mmol/L (ref 20–29)
Calcium: 10.1 mg/dL (ref 8.7–10.2)
Chloride: 100 mmol/L (ref 96–106)
Creatinine, Ser: 0.8 mg/dL (ref 0.57–1.00)
GFR calc Af Amer: 99 mL/min/{1.73_m2} (ref >59)
GFR calc non Af Amer: 86 mL/min/{1.73_m2} (ref >59)
Globulin, Total: 2.5 g/dL (ref 1.5–4.5)
Glucose: 73 mg/dL (ref 65–99)
Potassium: 4.4 mmol/L (ref 3.5–5.2)
Sodium: 141 mmol/L (ref 134–144)
Total Protein: 6.9 g/dL (ref 6.0–8.5)

## 2019-01-24 ENCOUNTER — Telehealth: Payer: Self-pay | Admitting: Family Medicine

## 2019-01-24 NOTE — Telephone Encounter (Signed)
Pt is wanting a cb concerning her low bp. Pt states she can not lower metoprolol succinate (TOPROL-XL) 50 MG 24 hr tablet, she was not dehydrated and not due to bleeding. Please advise at [phone]

## 2019-01-25 ENCOUNTER — Telehealth: Payer: Self-pay | Admitting: General Practice

## 2019-01-25 NOTE — Telephone Encounter (Signed)
I am trying to get ct angio approved with insurance.  What is provider trying to [place] out? Thanks

## 2019-01-25 NOTE — Telephone Encounter (Signed)
Please advise 

## 2019-01-25 NOTE — Addendum Note (Signed)
Addended by: Rutherford Guys on: 01/25/2019 03:16 PM   Modules accepted: Orders

## 2019-01-25 NOTE — Telephone Encounter (Signed)
Sorry CT angio order was an entry error. Please cancel order. I have ordered corrected ct abd pelvis w contrast. thanks

## 2019-02-01 ENCOUNTER — Telehealth: Payer: Self-pay | Admitting: General Practice

## 2019-02-01 ENCOUNTER — Other Ambulatory Visit: Payer: No Typology Code available for payment source

## 2019-02-01 ENCOUNTER — Other Ambulatory Visit: Payer: Self-pay | Admitting: Family Medicine

## 2019-02-01 ENCOUNTER — Ambulatory Visit
Admission: RE | Admit: 2019-02-01 | Discharge: 2019-02-01 | Disposition: A | Discharge status: Home / Self Care | Payer: BC Managed Care – PPO | Source: Ambulatory Visit | Attending: Family Medicine | Admitting: Family Medicine

## 2019-02-01 DIAGNOSIS — K625 Hemorrhage of anus and rectum: Secondary | ICD-10-CM

## 2019-02-01 DIAGNOSIS — R1032 Left lower quadrant pain: Secondary | ICD-10-CM

## 2019-02-01 DIAGNOSIS — R197 Diarrhea, unspecified: Secondary | ICD-10-CM

## 2019-02-01 DIAGNOSIS — D3501 Benign neoplasm of right adrenal gland: Secondary | ICD-10-CM | POA: Diagnosis not present

## 2019-02-01 DIAGNOSIS — K6289 Other specified diseases of anus and rectum: Secondary | ICD-10-CM

## 2019-02-01 DIAGNOSIS — R6889 Other general symptoms and signs: Secondary | ICD-10-CM

## 2019-02-01 DIAGNOSIS — K5730 Diverticulosis of large intestine without perforation or abscess without bleeding: Secondary | ICD-10-CM | POA: Diagnosis not present

## 2019-02-01 MED ORDER — IOPAMIDOL (ISOVUE-300) INJECTION 61%
100.0000 mL | Freq: Once | INTRAVENOUS | Status: AC | PRN
  Administered 2019-02-01: 100 mL via INTRAVENOUS

## 2019-02-01 NOTE — Telephone Encounter (Signed)
Explained CT results per Dr. Ardyth Gal note. Patient voiced understanding

## 2019-02-01 NOTE — Telephone Encounter (Signed)
Pt would like results of her [place] this am.  Pt is concerned that she got referral to GI so quickly, and thinks things are more serious. Would like a call back asap.

## 2019-02-01 NOTE — Progress Notes (Signed)
Pt has an appt 9/1 with gastro

## 2019-02-03 ENCOUNTER — Ambulatory Visit (INDEPENDENT_AMBULATORY_CARE_PROVIDER_SITE_OTHER): Payer: BC Managed Care – PPO | Admitting: Gastroenterology

## 2019-02-03 ENCOUNTER — Other Ambulatory Visit: Payer: Self-pay

## 2019-02-03 ENCOUNTER — Encounter: Payer: Self-pay | Admitting: Gastroenterology

## 2019-02-03 VITALS — BP 110/60 | HR 85 | Temp 98.4°F | Ht 62.5 in | Wt 131.0 lb

## 2019-02-03 DIAGNOSIS — K625 Hemorrhage of anus and rectum: Secondary | ICD-10-CM

## 2019-02-03 DIAGNOSIS — R194 Change in bowel habit: Secondary | ICD-10-CM | POA: Insufficient documentation

## 2019-02-03 DIAGNOSIS — R1030 Lower abdominal pain, unspecified: Secondary | ICD-10-CM | POA: Diagnosis not present

## 2019-02-03 MED ORDER — NA SULFATE-K SULFATE-MG SULF 17.5-3.13-1.6 GM/177ML PO SOLN: ORAL | 0 refills | Status: DC

## 2019-02-03 MED ORDER — HYOSCYAMINE SULFATE 0.125 MG SL SUBL: 0.1250 mg | SUBLINGUAL_TABLET | Freq: Three times a day (TID) | SUBLINGUAL | 1 refills | Status: DC | PRN

## 2019-02-03 NOTE — Progress Notes (Signed)
02/03/2019 MERIA RICHHART [id number] [birth year]   HISTORY OF PRESENT ILLNESS: This is a pleasant 53 year old female who is new to our office.  She was referred here by her PCP, Dr. Pamella Pert, for evaluation regarding issues with rectal bleeding, alternating bowel habits, and abdominal pain.  She tells me that she has been alternating between constipation and diarrhea.  By constipation she means that she only goes a couple days without a bowel movement, but when she has the diarrhea she says sometimes she just cannot get out of the house.  She reports lower abdominal discomfort and heaviness.  Symptoms have been present for several months, but worse so over the past 2 or 3 months.  She has seen bright red rectal bleeding as well.  She had a negative Cologuard study in May of this year for colon cancer screening.  Her mom has history of colon polyps and diverticulosis, but no other family history of GI issues to her knowledge.  She had a CT scan of the abdomen pelvis with contrast just 2 days ago that showed sigmoid diverticula without diverticulitis.  There is also noted to be chronic muscular hypertrophy in the sigmoid colon likely due to chronic diverticulosis, appearance similar to 2010 study.  Recent CBC and CMP within normal limits.   Past Medical History:  Diagnosis Date  . Acne   . Depression   . History of palpitations   . Hypertension   . Hypothyroidism   . Thyroid disease    hypothyroidism   Past Surgical History:  Procedure Laterality Date  . APPENDECTOMY  1994  . BREAST ENHANCEMENT SURGERY    . Cardiometabolic Testing  0000000   normal effort with peak VO2 of 73% predicted, HR accelerated at 97% predcited, HR on VO2 curve showed steep invline at anaerobic threshold - low risk, abnormal  . SHOULDER SURGERY    . TONSILECTOMY, ADENOIDECTOMY, BILATERAL MYRINGOTOMY AND TUBES    . TRANSTHORACIC ECHOCARDIOGRAM  03/2005   EF 55-65%, normal LV systolic function - no LV  regional wall motion abnormalities    reports that she has never smoked. She has never used smokeless tobacco. She reports current alcohol use. She reports that she does not use drugs. family history includes Breast cancer in her maternal grandmother; Colon polyps in her mother; Depression in her sister; Diverticulitis in her mother; Heart disease in her paternal grandfather and paternal grandmother; Hyperlipidemia in her father; Hypertension in her father and mother. Allergies  Allergen Reactions  . Azo [Phenazopyridine Hcl] Hives  . Flagyl [Metronidazole] Hives  . Nitrofurantoin Monohyd Macro Rash    Flu like sxs  . Red Dye Hives  . Lodine [Etodolac]     "Advil is ok"  . Macrolides And Ketolides       Outpatient Encounter Medications as of 02/03/2019  Medication Sig  . b complex vitamins tablet Take 1 tablet by mouth daily.  Marland Kitchen co-enzyme Q-10 30 MG capsule Take 300 mg by mouth daily.   Marland Kitchen L-Arginine 1000 MG TABS Take 9,000 mg by mouth.  . levothyroxine (SYNTHROID, LEVOTHROID) 88 MCG tablet Take 88 mcg by mouth daily before breakfast.  . metoprolol succinate (TOPROL-XL) 50 MG 24 hr tablet TAKE 1 TABLET BY MOUTH WITH OR IMMEDIATELY FOLLOWING A MEAL.  . Multiple Vitamin (MULTIVITAMIN) tablet Take 1 tablet by mouth daily.  . valACYclovir (VALTREX) 500 MG tablet    No facility-administered encounter medications on file as of 02/03/2019.      REVIEW OF SYSTEMS  :  All other systems reviewed and negative except where noted in the History of Present Illness.   PHYSICAL EXAM: BP 110/60   Pulse 85   Temp 98.4 F (36.9 C)   Ht 5' 2.5" (1.588 m)   Wt 131 lb (59.4 kg)   BMI 23.58 kg/m  General: Well developed white female in no acute distress Head: Normocephalic and atraumatic Eyes:  Sclerae anicteric, conjunctiva pink. Ears: Normal auditory acuity Lungs: Clear throughout to auscultation; no increased WOB. Heart: Regular rate and rhythm; no M/R/G. Abdomen: Soft, non-distended.  BS  present.  Non-tender. Rectal:  Will be done at the time of colonoscopy. Musculoskeletal: Symmetrical with no gross deformities  Skin: No lesions on visible extremities Extremities: No edema  Neurological: Alert oriented x 4, grossly non-focal Psychological:  Alert and cooperative. Normal mood and affect  ASSESSMENT AND PLAN: *53 year old female with complaints of alternating constipation diarrhea, lower abdominal pain, and rectal bleeding.  Symptoms have been present for months, but worse of the last 2 or 3 months.  She had a negative Cologuard in May of this year for colon cancer screening.  Does use powder fiber supplement in her smoothies on a daily basis.  We will plan for colonoscopy to rule out IBD, etc.   CC:  Rutherford Guys, MD

## 2019-02-03 NOTE — Patient Instructions (Addendum)
If you are age 52 or older, your body mass index should be between 23-30. Your Body mass index is 23.58 kg/m. If this is out of the aforementioned range listed, please consider follow up with your Primary Care Provider.  If you are age 19 or younger, your body mass index should be between 19-25. Your Body mass index is 23.58 kg/m. If this is out of the aformentioned range listed, please consider follow up with your Primary Care Provider.   You have been scheduled for a colonoscopy. Please follow written instructions given to you at your visit today.  Please pick up your prep supplies at the pharmacy within the next 1-3 days. If you use inhalers (even only as needed), please bring them with you on the day of your procedure. Your physician has requested that you go to www.startemmi.com and enter the access code given to you at your visit today. This web site gives a general overview about your procedure. However, you should still follow specific instructions given to you by our office regarding your preparation for the procedure.  We have sent the following medications to your pharmacy for you to pick up at your convenience: Suprep Levsin  Thank you for choosing me and [facility].   Alonza Bogus, PA-C

## 2019-02-08 ENCOUNTER — Telehealth: Payer: Self-pay

## 2019-02-08 NOTE — Telephone Encounter (Signed)
Covid-19 screening questions   Do you now or have you had a fever in the last 14 days? NO   Do you have any respiratory symptoms of shortness of breath or cough now or in the last 14 days? NO  Do you have any family members or close contacts with diagnosed or suspected Covid-19 in the past 14 days? NO  Have you been tested for Covid-19 and found to be positive? NO        

## 2019-02-09 ENCOUNTER — Ambulatory Visit (AMBULATORY_SURGERY_CENTER): Payer: BC Managed Care – PPO | Admitting: Gastroenterology

## 2019-02-09 ENCOUNTER — Encounter: Payer: Self-pay | Admitting: Gastroenterology

## 2019-02-09 ENCOUNTER — Other Ambulatory Visit: Payer: Self-pay | Admitting: Gastroenterology

## 2019-02-09 ENCOUNTER — Other Ambulatory Visit: Payer: Self-pay

## 2019-02-09 VITALS — BP 148/56 | HR 75 | Temp 98.0°F | Resp 21 | Ht 62.0 in | Wt 131.0 lb

## 2019-02-09 DIAGNOSIS — K5730 Diverticulosis of large intestine without perforation or abscess without bleeding: Secondary | ICD-10-CM | POA: Diagnosis not present

## 2019-02-09 DIAGNOSIS — R194 Change in bowel habit: Secondary | ICD-10-CM

## 2019-02-09 DIAGNOSIS — K648 Other hemorrhoids: Secondary | ICD-10-CM

## 2019-02-09 DIAGNOSIS — K621 Rectal polyp: Secondary | ICD-10-CM | POA: Diagnosis not present

## 2019-02-09 DIAGNOSIS — D128 Benign neoplasm of rectum: Secondary | ICD-10-CM

## 2019-02-09 DIAGNOSIS — K625 Hemorrhage of anus and rectum: Secondary | ICD-10-CM | POA: Diagnosis not present

## 2019-02-09 DIAGNOSIS — K635 Polyp of colon: Secondary | ICD-10-CM | POA: Diagnosis not present

## 2019-02-09 DIAGNOSIS — K6282 Dysplasia of anus: Secondary | ICD-10-CM | POA: Diagnosis not present

## 2019-02-09 DIAGNOSIS — D122 Benign neoplasm of ascending colon: Secondary | ICD-10-CM

## 2019-02-09 MED ORDER — SODIUM CHLORIDE 0.9 % IV SOLN: 500.0000 mL | Freq: Once | INTRAVENOUS | Status: DC

## 2019-02-09 NOTE — Patient Instructions (Signed)
Please read handouts provided. Await pathology results. Continue present medications.      YOU HAD AN ENDOSCOPIC PROCEDURE TODAY AT THE  ENDOSCOPY CENTER:   Refer to the procedure report that was given to you for any specific questions about what was found during the examination.  If the procedure report does not answer your questions, please call your gastroenterologist to clarify.  If you requested that your care partner not be given the details of your procedure findings, then the procedure report has been included in a sealed envelope for you to review at your convenience later.  YOU SHOULD EXPECT: Some feelings of bloating in the abdomen. Passage of more gas than usual.  Walking can help get rid of the air that was put into your GI tract during the procedure and reduce the bloating. If you had a lower endoscopy (such as a colonoscopy or flexible sigmoidoscopy) you may notice spotting of blood in your stool or on the toilet paper. If you underwent a bowel prep for your procedure, you may not have a normal bowel movement for a few days.  Please Note:  You might notice some irritation and congestion in your nose or some drainage.  This is from the oxygen used during your procedure.  There is no need for concern and it should clear up in a day or so.  SYMPTOMS TO REPORT IMMEDIATELY:   Following lower endoscopy (colonoscopy or flexible sigmoidoscopy):  Excessive amounts of blood in the stool  Significant tenderness or worsening of abdominal pains  Swelling of the abdomen that is new, acute  Fever of 100F or higher    For urgent or emergent issues, a gastroenterologist can be reached at any hour by calling [phone].   DIET:  We do recommend a small meal at first, but then you may proceed to your regular diet.  Drink plenty of fluids but you should avoid alcoholic beverages for 24 hours.  ACTIVITY:  You should plan to take it easy for the rest of today and you should NOT  DRIVE or use heavy machinery until tomorrow (because of the sedation medicines used during the test).    FOLLOW UP: Our staff will call the number listed on your records 48-72 hours following your procedure to check on you and address any questions or concerns that you may have regarding the information given to you following your procedure. If we do not reach you, we will leave a message.  We will attempt to reach you two times.  During this call, we will ask if you have developed any symptoms of COVID 19. If you develop any symptoms (ie: fever, flu-like symptoms, shortness of breath, cough etc.) before then, please call [phone].  If you test positive for Covid 19 in the 2 weeks post procedure, please call and report this information to us.    If any biopsies were taken you will be contacted by phone or by letter within the next 1-3 weeks.  Please call us at [phone] if you have not heard about the biopsies in 3 weeks.    SIGNATURES/CONFIDENTIALITY: You and/or your care partner have signed paperwork which will be entered into your electronic medical record.  These signatures attest to the fact that that the information above on your After Visit Summary has been reviewed and is understood.  Full responsibility of the confidentiality of this discharge information lies with you and/or your care-partner. 

## 2019-02-09 NOTE — Progress Notes (Signed)
Called to room to assist during endoscopic procedure.  Patient ID and intended procedure confirmed with present staff. Received instructions for my participation in the procedure from the performing physician.  

## 2019-02-09 NOTE — Progress Notes (Signed)
PT taken to PACU. Monitors in place. VSS. Report given to RN. 

## 2019-02-09 NOTE — Op Note (Signed)
[place] Patient Name: Kimberly Macdonald Procedure Date: 02/09/2019 10:34 AM MRN: [id number] Endoscopist: Mauri Pole , MD Age: 53 Referring MD:  Date of Birth: 1965/12/19 Gender: Female Account #: [id number] Procedure:                Colonoscopy Indications:              Evaluation of unexplained GI bleeding presenting                            with Hematochezia Medicines:                Monitored Anesthesia Care Procedure:                Pre-Anesthesia Assessment:                           - Prior to the procedure, a History and Physical                            was performed, and patient medications and                            allergies were reviewed. The patient's tolerance of                            previous anesthesia was also reviewed. The risks                            and benefits of the procedure and the sedation                            options and risks were discussed with the patient.                            All questions were answered, and informed consent                            was obtained. Prior Anticoagulants: The patient has                            taken no previous anticoagulant or antiplatelet                            agents. ASA Grade Assessment: II - A patient with                            mild systemic disease. After reviewing the risks                            and benefits, the patient was deemed in                            satisfactory condition to undergo the procedure.  After obtaining informed consent, the colonoscope                            was passed under direct vision. Throughout the                            procedure, the patient's blood pressure, pulse, and                            oxygen saturations were monitored continuously. The                            Colonoscope was introduced through the anus and                            advanced to the the cecum,  identified by                            appendiceal orifice and ileocecal valve. The                            colonoscopy was performed without difficulty. The                            patient tolerated the procedure well. The quality                            of the bowel preparation was excellent. The                            ileocecal valve, appendiceal orifice, and rectum                            were photographed. Scope In: 10:42:25 AM Scope Out: 11:13:45 AM Scope Withdrawal Time: 0 hours 14 minutes 52 seconds  Total Procedure Duration: 0 hours 31 minutes 20 seconds  Findings:                 The perianal and digital rectal examinations were                            normal.                           A 8 mm polyp was found in the proximal ascending                            colon. The polyp was sessile. The polyp was removed                            with a cold snare. Resection and retrieval were                            complete.  Two sessile polyps were found in the rectum. The                            polyps were 1 to 3 mm in size. These polyps were                            removed with a cold biopsy forceps. Resection and                            retrieval were complete.                           Scattered small-mouthed diverticula were found in                            the recto-sigmoid colon, sigmoid colon and                            descending colon.                           Non-bleeding internal hemorrhoids were found during                            retroflexion. The hemorrhoids were small. Complications:            No immediate complications. Estimated Blood Loss:     Estimated blood loss was minimal. Impression:               - One 8 mm polyp in the proximal ascending colon,                            removed with a cold snare. Resected and retrieved.                           - Two 1 to 3 mm polyps in the rectum,  removed with                            a cold biopsy forceps. Resected and retrieved.                           - Diverticulosis in the recto-sigmoid colon, in the                            sigmoid colon and in the descending colon.                           - Non-bleeding internal hemorrhoids. Recommendation:           - Resume previous diet.                           - Continue present medications.                           -  Await pathology results.                           - Repeat colonoscopy in 3 - 5 years for                            surveillance based on pathology results.                           - Return to GI clinic PRN for hemorrhoidal band                            ligation. Mauri Pole, MD 02/09/2019 11:18:12 AM This report has been signed electronically.

## 2019-02-09 NOTE — Progress Notes (Signed)
Temps taken by KA VS taken by CW

## 2019-02-10 ENCOUNTER — Telehealth: Payer: Self-pay | Admitting: *Deleted

## 2019-02-10 MED ORDER — HYDROCORTISONE ACETATE 25 MG RE SUPP: 25.0000 mg | Freq: Two times a day (BID) | RECTAL | 1 refills | Status: DC

## 2019-02-10 NOTE — Telephone Encounter (Signed)
Patient call was transferred to me from downstairs.  Patient was scoped yesterday, and she stated that she had 2 blood clots about 1 1/2 hours ago. She stated that there were only two.  Now blood is on toilet paper about the size of a dime..  Stated that her abd pain was "crampy" and rated it "about a 5."  It was hard to hear the patient.  She was driving while talking to me.

## 2019-02-10 NOTE — Telephone Encounter (Signed)
Note was forwarded to Dr. Hilarie Fredrickson per Dr. Hilarie Fredrickson.  Christell Constant, RN to wait for reply.  Given report.

## 2019-02-10 NOTE — Telephone Encounter (Signed)
Called patient back, she passed 2 small clots with bowel movement and also noticed small amount bright red blood when she wiped. Likely small volume bleeding from polyps removed from distal rectum/anal canal. Reassured her and advised her to use Anusol suppository at bedtime X 3 days.  Call with any further bleeding or abdominal pain. Thanks

## 2019-02-11 NOTE — Telephone Encounter (Signed)
  Follow up Call-  Call back number 02/09/2019  Post procedure Call Back phone  # [phone]  Permission to leave phone message Yes  Some recent data might be hidden     Patient questions:  Do you have a fever, pain , or abdominal swelling? No. Pain Score  0 *  Have you tolerated food without any problems? Yes.    Have you been able to return to your normal activities? Yes.    Do you have any questions about your discharge instructions: Diet   No. Medications  No. Follow up visit  No.  Do you have questions or concerns about your Care? No.  See previous notes.  On-call provider contacted patient yesterday.  Patient said that as of this morning she hasn't passed any more clots and feels like she's improving and not having any issues as of now.  She is aware to call back if she has any further problems.   Actions: * If pain score is 4 or above: No action needed, pain <4.  1. Have you developed a fever since your procedure? NO  2.   Have you had an respiratory symptoms (SOB or cough) since your procedure? NO  3.   Have you tested positive for COVID 19 since your procedure NO  4.   Have you had any family members/close contacts diagnosed with the COVID 19 since your procedure?  NO   If yes to any of these questions please route to Joylene John, RN and Alphonsa Gin, RN.

## 2019-02-17 ENCOUNTER — Encounter: Payer: Self-pay | Admitting: Gastroenterology

## 2019-02-27 ENCOUNTER — Other Ambulatory Visit: Payer: Self-pay | Admitting: Family Medicine

## 2019-02-27 DIAGNOSIS — R000 Tachycardia, unspecified: Secondary | ICD-10-CM

## 2019-03-02 ENCOUNTER — Telehealth: Payer: Self-pay | Admitting: Gastroenterology

## 2019-03-02 NOTE — Telephone Encounter (Signed)
Pt inquired about colonoscopy results.  She stated that result letter has not arrived.

## 2019-03-02 NOTE — Telephone Encounter (Signed)
Left a message to call us again. Letter is in the chart. I can read the results to her. It was mailed on 02/23/19.

## 2019-03-16 ENCOUNTER — Other Ambulatory Visit: Payer: Self-pay

## 2019-03-16 ENCOUNTER — Ambulatory Visit (INDEPENDENT_AMBULATORY_CARE_PROVIDER_SITE_OTHER): Payer: BC Managed Care – PPO | Admitting: Gastroenterology

## 2019-03-16 ENCOUNTER — Encounter: Payer: Self-pay | Admitting: Gastroenterology

## 2019-03-16 VITALS — BP 92/64 | HR 64 | Temp 98.7°F | Ht 62.5 in | Wt 133.0 lb

## 2019-03-16 DIAGNOSIS — K625 Hemorrhage of anus and rectum: Secondary | ICD-10-CM | POA: Diagnosis not present

## 2019-03-16 DIAGNOSIS — K641 Second degree hemorrhoids: Secondary | ICD-10-CM

## 2019-03-16 DIAGNOSIS — K6282 Dysplasia of anus: Secondary | ICD-10-CM

## 2019-03-16 NOTE — Patient Instructions (Signed)
Referral to [facility], we will contact you with that appointment  If you are age 52 or older, your body mass index should be between 23-30. Your Body mass index is 23.94 kg/m. If this is out of the aforementioned range listed, please consider follow up with your Primary Care Provider.  If you are age 62 or younger, your body mass index should be between 19-25. Your Body mass index is 23.94 kg/m. If this is out of the aformentioned range listed, please consider follow up with your Primary Care Provider.    I appreciate the  opportunity to care for you  Thank You   Harl Bowie , MD

## 2019-03-16 NOTE — Progress Notes (Signed)
Kimberly Macdonald    [id number]    [birth year]  Primary Care Physician:Santiago, Lilia Argue, MD  Referring Physician: Rutherford Guys, MD [address] [place],  [place] [address]   Chief complaint: Hemorrhoids, and elevation  HPI:  53 year old female here for follow-up visit after recent colonoscopy to discuss further management of anal lesion and hemorrhoids. Colonoscopy February 09, 2019: 8 mm polyp [sessile serrated adenoma] removed from ascending colon, diminutive polypoid lesions removed from anal canal,- LOW-GRADE SQUAMOUS INTRAEPITHELIAL LESION (AIN1 WITH CONDYLOMATOUS FEATURES)I.  Left-sided diverticulosis and internal hemorrhoids. She continues to have intermittent bright red blood per rectum mostly when she wipes after bowel movement. Denies any abdominal pain, loss of appetite or weight loss.  Outpatient Encounter Medications as of 03/16/2019  Medication Sig  . b complex vitamins tablet Take 1 tablet by mouth daily.  Marland Kitchen co-enzyme Q-10 30 MG capsule Take 300 mg by mouth daily.   Marland Kitchen L-Arginine 1000 MG TABS Take 9,000 mg by mouth.  . levothyroxine (SYNTHROID, LEVOTHROID) 88 MCG tablet Take 88 mcg by mouth daily before breakfast.  . metoprolol succinate (TOPROL-XL) 50 MG 24 hr tablet TAKE 1 TABLET BY MOUTH WITH OR IMMEDIATELY FOLLOWING A MEAL.  . Multiple Vitamin (MULTIVITAMIN) tablet Take 1 tablet by mouth daily.  . valACYclovir (VALTREX) 500 MG tablet   . [DISCONTINUED] hydrocortisone (ANUSOL-HC) 25 MG suppository Place 1 suppository (25 mg total) rectally every 12 (twelve) hours.  . [DISCONTINUED] hyoscyamine (LEVSIN SL) 0.125 MG SL tablet Place 1 tablet (0.125 mg total) under the tongue every 8 (eight) hours as needed.   No facility-administered encounter medications on file as of 03/16/2019.     Allergies as of 03/16/2019 - Review Complete 03/16/2019  Allergen Reaction Noted  . Azo [phenazopyridine hcl] Hives 05/17/2011  . Flagyl [metronidazole] Hives 05/05/1990   . Nitrofurantoin monohyd macro Rash 06/09/2011  . Red dye Hives 03/30/2016  . Lodine [etodolac]  12/03/1993  . Macrolides and ketolides  06/09/2011    Past Medical History:  Diagnosis Date  . Acne   . Cataract    right eye, getting a second opinion  . Depression   . History of palpitations   . Hypertension   . Hypothyroidism   . Thyroid disease    hypothyroidism    Past Surgical History:  Procedure Laterality Date  . APPENDECTOMY  1994  . BREAST ENHANCEMENT SURGERY    . Cardiometabolic Testing  0000000   normal effort with peak VO2 of 73% predicted, HR accelerated at 97% predcited, HR on VO2 curve showed steep invline at anaerobic threshold - low risk, abnormal  . SHOULDER SURGERY    . TONSILECTOMY, ADENOIDECTOMY, BILATERAL MYRINGOTOMY AND TUBES     Patient denies having tubes in ears  . TRANSTHORACIC ECHOCARDIOGRAM  03/2005   EF 55-65%, normal LV systolic function - no LV regional wall motion abnormalities    Family History  Problem Relation Age of Onset  . Hypertension Mother   . Diverticulitis Mother        part of colon removed  . Colon polyps Mother   . Hyperlipidemia Father   . Hypertension Father   . Depression Sister   . Breast cancer Maternal Grandmother   . Heart disease Paternal Grandmother        MI   . Heart disease Paternal Grandfather        MI  . Colon cancer Neg Hx   . Esophageal cancer Neg Hx   .  Rectal cancer Neg Hx     Social History   Socioeconomic History  . Marital status: Married    Spouse name: Not on file  . Number of children: 3  . Years of education: 59yr coll.  . Highest education level: Not on file  Occupational History  . Occupation: Engineering geologist  Social Needs  . Financial resource strain: Not on file  . Food insecurity    Worry: Not on file    Inability: Not on file  . Transportation needs    Medical: Not on file    Non-medical: Not on file  Tobacco Use  . Smoking status: Never Smoker  . Smokeless tobacco: Never  Used  Substance and Sexual Activity  . Alcohol use: Yes    Comment: occasionally (twice yearly)  . Drug use: No  . Sexual activity: Not Currently    Partners: Male  Lifestyle  . Physical activity    Days per week: Not on file    Minutes per session: Not on file  . Stress: Not on file  Relationships  . Social Herbalist on phone: Not on file    Gets together: Not on file    Attends religious service: Not on file    Active member of club or organization: Not on file    Attends meetings of clubs or organizations: Not on file    Relationship status: Not on file  . Intimate partner violence    Fear of current or ex partner: Not on file    Emotionally abused: Not on file    Physically abused: Not on file    Forced sexual activity: Not on file  Other Topics Concern  . Not on file  Social History Narrative  . Not on file      Review of systems: Review of Systems  Constitutional: Negative for fever and chills.  HENT: Negative.   Eyes: Negative for blurred vision.  Respiratory: Negative for cough, shortness of breath and wheezing.   Cardiovascular: Negative for chest pain and palpitations.  Gastrointestinal: as per HPI Genitourinary: Negative for dysuria, urgency, frequency and hematuria.  Musculoskeletal: Negative for myalgias, back pain and joint pain.  Skin: Negative for itching and rash.  Neurological: Negative for dizziness, tremors, focal weakness, seizures and loss of consciousness.  Endo/Heme/Allergies: Positive for seasonal allergies.  Psychiatric/Behavioral: Negative for depression, suicidal ideas and hallucinations.  All other systems reviewed and are negative.   Physical Exam: Vitals:   03/16/19 1557  BP: 92/64  Pulse: 64  Temp: 98.7 F (37.1 C)   Body mass index is 23.94 kg/m. Gen:      No acute distress HEENT:  EOMI, sclera anicteric Neck:     No masses; no thyromegaly Lungs:    Clear to auscultation bilaterally; normal respiratory effort  CV:         Regular rate and rhythm; no murmurs Abd:      + bowel sounds; soft, non-tender; no palpable masses, no distension Ext:    No edema; adequate peripheral perfusion Skin:      Warm and dry; no rash Neuro: alert and oriented x 3 Psych: normal mood and affect Rectal exam: Normal anal sphincter tone, no anal fissure or external hemorrhoids Anoscopy: Small grade 2 internal hemorrhoids, no active bleeding, normal dentate line, no visible nodules  Data Reviewed:  Reviewed labs, radiology imaging, old records and pertinent past GI work up   Assessment and Plan/Recommendations:  53 year old female with hypertension, hypothyroidism, symptomatic internal  hemorrhoids and anal intraepithelial neoplasia type I Reassured patient that biopsies showed low-grade dysplasia, AIN type I Will refer to Dr. Leighton Ruff for evaluation and removal of any residual anal lesion. No large palpable or visible nodule on anal exam and anoscopy  We will plan for internal hemorrhoidal band ligation after the anal exam by Dr. Marcello Moores and removal of any residual anal lesion  Benefiber 1 tablespoon 3 times daily with meals  15 minutes was spent face-to-face with the patient. Greater than 50% of the time used for counseling as well as treatment plan and follow-up. She had multiple questions which were answered to her satisfaction  K. Denzil Magnuson , MD    CC: Rutherford Guys, MD

## 2019-03-21 ENCOUNTER — Telehealth: Payer: Self-pay | Admitting: *Deleted

## 2019-03-21 ENCOUNTER — Encounter: Payer: Self-pay | Admitting: Gastroenterology

## 2019-03-21 ENCOUNTER — Telehealth: Payer: Self-pay | Admitting: Gastroenterology

## 2019-03-21 NOTE — Telephone Encounter (Signed)
Refer to Dr Marcello Moores for AIN

## 2019-03-21 NOTE — Telephone Encounter (Signed)
We are working on it , waiting on office note to be signed

## 2019-03-21 NOTE — Telephone Encounter (Signed)
Referral and records faxed today to [facility] Dr Marcello Moores  They will contact patient to schedule  Informed patient

## 2019-03-21 NOTE — Telephone Encounter (Signed)
Records faxed today to [facility]

## 2019-03-30 DIAGNOSIS — Z6825 Body mass index (BMI) 25.0-25.9, adult: Secondary | ICD-10-CM | POA: Diagnosis not present

## 2019-03-30 DIAGNOSIS — E663 Overweight: Secondary | ICD-10-CM | POA: Diagnosis not present

## 2019-04-02 DIAGNOSIS — R0789 Other chest pain: Secondary | ICD-10-CM | POA: Diagnosis not present

## 2019-04-02 DIAGNOSIS — M25521 Pain in right elbow: Secondary | ICD-10-CM | POA: Diagnosis not present

## 2019-04-02 DIAGNOSIS — Z9181 History of falling: Secondary | ICD-10-CM | POA: Diagnosis not present

## 2019-04-04 ENCOUNTER — Ambulatory Visit: Payer: Self-pay | Admitting: General Surgery

## 2019-04-04 DIAGNOSIS — K6282 Dysplasia of anus: Secondary | ICD-10-CM | POA: Diagnosis not present

## 2019-04-04 LAB — HM COLONOSCOPY

## 2019-04-04 NOTE — H&P (Signed)
History of Present Illness Leighton Ruff MD; 99991111 10:22 AM) The patient is a 53 year old female who presents with anal lesions. 53 year old female who presented to her GI doctor with complaints of irregular bowel habits and some rectal bleeding while wiping. She underwent a colonoscopy which showed an ascending colon polyp and a polypoid lesion in the distal rectum. 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 She does undergo cervical cancer screenings and has not had any abnormal results.   Past Surgical History Sallyanne Kuster, CMA; 04/04/2019 9:59 AM) Appendectomy Breast Augmentation Bilateral. Colon Polyp Removal - Colonoscopy Gallbladder Surgery - Laparoscopic Shoulder Surgery Left. Tonsillectomy  Diagnostic Studies History Sallyanne Kuster, [place]; 04/04/2019 9:59 AM) Colonoscopy within last year Mammogram within last year Pap Smear 1-5 years ago  Allergies Sallyanne Kuster, [place]; 04/04/2019 10:02 AM) Abel Presto Tabs *GENITOURINARY AGENTS - MISCELLANEOUS* Flagyl *ANTI-INFECTIVE AGENTS - MISC.* Dye FDC Red 40 (Carmine Red) *PHARMACEUTICAL ADJUVANTS* Nitrofurantoin *URINARY ANTI-INFECTIVES* Lodine *ANALGESICS - ANTI-INFLAMMATORY* Allergies Reconciled  Medication History Sallyanne Kuster, CMA; 04/04/2019 10:03 AM) Phentermine HCl (37.5MG  Capsule, Oral) Active. Hyoscyamine Sulfate (0.125MG  Tab Sublingual, Sublingual) Active. Levothyroxine Sodium (88MCG Tablet, Oral) Active. Metoprolol Succinate ER (50MG  Tablet ER 24HR, Oral) Active. valACYclovir HCl (500MG  Tablet, Oral) Active. Medications Reconciled  Social History Sallyanne Kuster, CMA; 04/04/2019 9:59 AM) Caffeine use Coffee. No drug use  Family History Sallyanne Kuster, [place]; 04/04/2019 9:59 AM) Breast Cancer Family Members In General. Cervical Cancer Family Members In General. Colon Polyps Mother. Depression Daughter, Sister. Diabetes Mellitus Family Members In General. Heart Disease Family Members In  General. Heart disease in female family member before age 88 Hypertension Family Members In General. Melanoma Father. Prostate Cancer Family Members In General. Respiratory Condition Son. Thyroid problems Family Members In General.  Pregnancy / Birth History Sallyanne Kuster, [place]; 04/04/2019 9:59 AM) Age of menopause 60-50 Contraceptive History Oral contraceptives. Gravida 6 Length (months) of breastfeeding 12-24 Maternal age 68-35 Para 2  Other Problems Sallyanne Kuster, [place]; 04/04/2019 9:59 AM) Back Pain Bladder Problems Cholelithiasis Depression Gastroesophageal Reflux Disease Hemorrhoids Other disease, cancer, significant illness Thyroid Disease     Review of Systems Sallyanne Kuster CMA; 04/04/2019 9:59 AM) General Not Present- Appetite Loss, Chills, Fatigue, Fever, Night Sweats, Weight Gain and Weight Loss. Skin Not Present- Change in Wart/Mole, Dryness, Hives, Jaundice, New Lesions, Non-Healing Wounds, Rash and Ulcer. HEENT Present- Wears glasses/contact lenses. Not Present- Earache, Hearing Loss, Hoarseness, Nose Bleed, Oral Ulcers, Ringing in the Ears, Seasonal Allergies, Sinus Pain, Sore Throat, Visual Disturbances and Yellow Eyes. Cardiovascular Not Present- Chest Pain, Difficulty Breathing Lying Down, Leg Cramps, Palpitations, Rapid Heart Rate, Shortness of Breath and Swelling of Extremities. Gastrointestinal Present- Abdominal Pain, Bloating and Hemorrhoids. Not Present- Bloody Stool, Change in Bowel Habits, Chronic diarrhea, Constipation, Difficulty Swallowing, Excessive gas, Gets full quickly at meals, Indigestion, Nausea, Rectal Pain and Vomiting. Female Genitourinary Not Present- Frequency, Nocturia, Painful Urination, Pelvic Pain and Urgency. Musculoskeletal Present- Muscle Pain. Not Present- Back Pain, Joint Pain, Joint Stiffness, Muscle Weakness and Swelling of Extremities. Neurological Not Present- Decreased Memory, Fainting, Headaches, Numbness,  Seizures, Tingling, Tremor, Trouble walking and Weakness. Psychiatric Present- Depression. Not Present- Anxiety, Bipolar, Change in Sleep Pattern, Fearful and Frequent crying. Endocrine Not Present- Cold Intolerance, Excessive Hunger, Hair Changes, Heat Intolerance, Hot flashes and New Diabetes. Hematology Present- Blood Thinners. Not Present- Easy Bruising, Excessive bleeding, Gland problems, HIV and Persistent Infections.  Vitals Sallyanne Kuster CMA; 04/04/2019 10:03 AM) 04/04/2019 10:03 AM Weight: 131.6 lb Height: 62in Body Surface Area: 1.6 m  Body Mass Index: 24.07 kg/m  Temp.: 97.16F  Pulse: 57 (Regular)  BP: 100/65 (Sitting, Left Arm, Standard)        Physical Exam Leighton Ruff MD; [id number] 10:18 AM)  General Mental Status-Alert. General Appearance-Not in acute distress. Build & Nutrition-Well nourished. Posture-Normal posture. Gait-Normal.  Head and Neck Head-normocephalic, atraumatic with no lesions or palpable masses. Trachea-midline.  Chest and Lung Exam Chest and lung exam reveals -on auscultation, normal breath sounds, no adventitious sounds and normal vocal resonance.  Cardiovascular Cardiovascular examination reveals -normal heart sounds, regular rate and rhythm with no murmurs and no digital clubbing, cyanosis, edema, increased warmth or tenderness.  Abdomen Inspection Inspection of the abdomen reveals - No Hernias. Palpation/Percussion Palpation and Percussion of the abdomen reveal - Soft, Non Tender, No Rigidity (guarding), No hepatosplenomegaly and No Palpable abdominal masses.  Rectal Anorectal Exam External - skin tag. Internal - normal internal exam.  Neurologic Neurologic evaluation reveals -alert and oriented x 3 with no impairment of recent or remote memory, normal attention span and ability to concentrate, normal sensation and normal coordination.  Musculoskeletal Normal Exam - Bilateral-Upper Extremity  Strength Normal and Lower Extremity Strength Normal.   Results Leighton Ruff MD; [id number] 10:22 AM) Procedures  Name Value Date ANOSCOPY, DIAGNOSTIC [id number]) [ Hemorrhoids ] Procedure Other: Procedure: Anoscopy....Marland KitchenMarland KitchenSurgeon: Marcello Moores....Marland KitchenMarland KitchenAfter the risks and benefits were explained, verbal consent was obtained for above procedure. A medical assistant chaperone was present thoroughout the entire procedure. ....Marland KitchenMarland KitchenAnesthesia: none....Marland KitchenMarland KitchenDiagnosis: rectal lesions....Marland KitchenMarland KitchenFindings: No lesions noted, grade 1 internal hemorrhoids, hard stool palpated in rectal vault.  Performed: 04/04/2019 10:22 AM    Assessment & Plan Leighton Ruff MD; [id number] 10:22 AM)  AIN GRADE I [id number]) Impression: 53 year old female who underwent colonoscopy for occasional rectal bleeding presents to the office with complaints of AIN found in distal rectal lesions noted on that colonoscopy. On exam today I do not see any anal lesions. We discussed serial anoscopy in the office versus high-resolution anoscopy in the operating room. We discussed that neither option has been proven to be more effective than the other. After going over the risk and benefits of each option patient has agreed to proceed with high-resolution anoscopy. Hopefully this will give [place] more information about her risk. We discussed laser ablation if only a few lesions are seen. We discussed the need for increased surveillance if multiple areas were seen within the anal canal. We discussed the typical recovery time and postoperative pain.

## 2019-04-04 NOTE — Telephone Encounter (Signed)
Patient was seen at [facility] on 04/04/2019 and office notes on Dr Jillyn Hidden desk

## 2019-04-13 DIAGNOSIS — Z87828 Personal history of other (healed) physical injury and trauma: Secondary | ICD-10-CM | POA: Diagnosis not present

## 2019-04-13 DIAGNOSIS — R921 Mammographic calcification found on diagnostic imaging of breast: Secondary | ICD-10-CM | POA: Diagnosis not present

## 2019-04-13 DIAGNOSIS — N644 Mastodynia: Secondary | ICD-10-CM | POA: Diagnosis not present

## 2019-04-13 DIAGNOSIS — R920 Mammographic microcalcification found on diagnostic imaging of breast: Secondary | ICD-10-CM | POA: Diagnosis not present

## 2019-05-05 ENCOUNTER — Other Ambulatory Visit: Payer: Self-pay

## 2019-05-05 ENCOUNTER — Encounter (HOSPITAL_BASED_OUTPATIENT_CLINIC_OR_DEPARTMENT_OTHER): Payer: Self-pay | Admitting: General Surgery

## 2019-05-05 NOTE — Progress Notes (Signed)
Spoke with: Glorianna NPO:  After Midnight, no gum, candy, or mints   Arrival time: 0900 AM Labs:Istat 8, EKG AM medications: None Pre op orders: Yes Ride home: Roselie Awkward (husband) [phone]

## 2019-05-09 ENCOUNTER — Other Ambulatory Visit (HOSPITAL_COMMUNITY)
Admission: RE | Admit: 2019-05-09 | Discharge: 2019-05-09 | Disposition: A | Discharge status: Home / Self Care | Payer: BC Managed Care – PPO | Source: Ambulatory Visit | Attending: General Surgery | Admitting: General Surgery

## 2019-05-09 DIAGNOSIS — Z20822 Contact with and (suspected) exposure to covid-19: Secondary | ICD-10-CM | POA: Insufficient documentation

## 2019-05-09 DIAGNOSIS — Z01812 Encounter for preprocedural laboratory examination: Secondary | ICD-10-CM | POA: Diagnosis not present

## 2019-05-10 LAB — NOVEL CORONAVIRUS, NAA (HOSP ORDER, SEND-OUT TO REF LAB; TAT 18-24 HRS): SARS-CoV-2, NAA: NOT DETECTED

## 2019-05-12 ENCOUNTER — Ambulatory Visit (HOSPITAL_BASED_OUTPATIENT_CLINIC_OR_DEPARTMENT_OTHER): Payer: BC Managed Care – PPO | Admitting: Anesthesiology

## 2019-05-12 ENCOUNTER — Other Ambulatory Visit: Payer: Self-pay

## 2019-05-12 ENCOUNTER — Ambulatory Visit (HOSPITAL_BASED_OUTPATIENT_CLINIC_OR_DEPARTMENT_OTHER)
Admission: RE | Admit: 2019-05-12 | Discharge: 2019-05-12 | Disposition: A | Discharge status: Home / Self Care | Payer: BC Managed Care – PPO | Attending: General Surgery | Admitting: General Surgery

## 2019-05-12 ENCOUNTER — Encounter (HOSPITAL_BASED_OUTPATIENT_CLINIC_OR_DEPARTMENT_OTHER)
Admission: RE | Disposition: A | Discharge status: Home / Self Care | Payer: Self-pay | Source: Home / Self Care | Attending: General Surgery

## 2019-05-12 ENCOUNTER — Encounter (HOSPITAL_BASED_OUTPATIENT_CLINIC_OR_DEPARTMENT_OTHER): Payer: Self-pay | Admitting: General Surgery

## 2019-05-12 DIAGNOSIS — Z79890 Hormone replacement therapy (postmenopausal): Secondary | ICD-10-CM | POA: Insufficient documentation

## 2019-05-12 DIAGNOSIS — E039 Hypothyroidism, unspecified: Secondary | ICD-10-CM | POA: Diagnosis not present

## 2019-05-12 DIAGNOSIS — F329 Major depressive disorder, single episode, unspecified: Secondary | ICD-10-CM | POA: Insufficient documentation

## 2019-05-12 DIAGNOSIS — K219 Gastro-esophageal reflux disease without esophagitis: Secondary | ICD-10-CM | POA: Diagnosis not present

## 2019-05-12 DIAGNOSIS — K6282 Dysplasia of anus: Secondary | ICD-10-CM | POA: Diagnosis not present

## 2019-05-12 DIAGNOSIS — I10 Essential (primary) hypertension: Secondary | ICD-10-CM | POA: Diagnosis not present

## 2019-05-12 DIAGNOSIS — I739 Peripheral vascular disease, unspecified: Secondary | ICD-10-CM | POA: Diagnosis not present

## 2019-05-12 DIAGNOSIS — M549 Dorsalgia, unspecified: Secondary | ICD-10-CM | POA: Insufficient documentation

## 2019-05-12 DIAGNOSIS — R9431 Abnormal electrocardiogram [ECG] [EKG]: Secondary | ICD-10-CM | POA: Diagnosis not present

## 2019-05-12 DIAGNOSIS — Z79899 Other long term (current) drug therapy: Secondary | ICD-10-CM | POA: Insufficient documentation

## 2019-05-12 DIAGNOSIS — R87613 High grade squamous intraepithelial lesion on cytologic smear of cervix (HGSIL): Secondary | ICD-10-CM | POA: Insufficient documentation

## 2019-05-12 HISTORY — DX: Personal history of colonic polyps: Z86.010

## 2019-05-12 HISTORY — DX: Dysplasia of cervix uteri, unspecified: N87.9

## 2019-05-12 HISTORY — DX: Personal history of colon polyps, unspecified: Z86.0100

## 2019-05-12 HISTORY — PX: HIGH RESOLUTION ANOSCOPY: SHX6345

## 2019-05-12 HISTORY — DX: Interstitial cystitis (chronic) without hematuria: N30.10

## 2019-05-12 HISTORY — DX: Diverticulosis of intestine, part unspecified, without perforation or abscess without bleeding: K57.90

## 2019-05-12 HISTORY — DX: Personal history of other diseases of the digestive system: Z87.19

## 2019-05-12 HISTORY — DX: Tachycardia, unspecified: R00.0

## 2019-05-12 HISTORY — DX: Hypotension, unspecified: I95.9

## 2019-05-12 HISTORY — DX: Personal history of other specified conditions: Z87.898

## 2019-05-12 HISTORY — DX: Other specified postprocedural states: Z98.890

## 2019-05-12 HISTORY — DX: Other specified symptoms and signs involving the circulatory and respiratory systems: R09.89

## 2019-05-12 HISTORY — DX: Presence of spectacles and contact lenses: Z97.3

## 2019-05-12 HISTORY — DX: Personal history of diseases of the blood and blood-forming organs and certain disorders involving the immune mechanism: Z86.2

## 2019-05-12 HISTORY — DX: Other hemorrhoids: K64.8

## 2019-05-12 HISTORY — DX: Gastro-esophageal reflux disease without esophagitis: K21.9

## 2019-05-12 HISTORY — DX: Dysplasia of anus: K62.82

## 2019-05-12 HISTORY — DX: Other specified postprocedural states: R11.2

## 2019-05-12 HISTORY — DX: Palpitations: R00.2

## 2019-05-12 HISTORY — DX: Personal history of other infectious and parasitic diseases: Z86.19

## 2019-05-12 LAB — POCT I-STAT, CHEM 8
BUN: 15 mg/dL (ref 6–20)
Calcium, Ion: 1.25 mmol/L (ref 1.15–1.40)
Chloride: 103 mmol/L (ref 98–111)
Creatinine, Ser: 0.6 mg/dL (ref 0.44–1.00)
Glucose, Bld: 94 mg/dL (ref 70–99)
HCT: 38 % (ref 36.0–46.0)
Hemoglobin: 12.9 g/dL (ref 12.0–15.0)
Potassium: 3.8 mmol/L (ref 3.5–5.1)
Sodium: 141 mmol/L (ref 135–145)
TCO2: 30 mmol/L (ref 22–32)

## 2019-05-12 SURGERY — ANOSCOPY, HIGH RESOLUTION
Anesthesia: Monitor Anesthesia Care | Site: Rectum

## 2019-05-12 MED ORDER — WHITE PETROLATUM EX OINT
TOPICAL_OINTMENT | CUTANEOUS | Status: AC
  Filled 2019-05-12: qty 15

## 2019-05-12 MED ORDER — ACETAMINOPHEN 650 MG RE SUPP
650.0000 mg | RECTAL | Status: DC | PRN
  Filled 2019-05-12: qty 1

## 2019-05-12 MED ORDER — ONDANSETRON HCL 4 MG/2ML IJ SOLN
INTRAMUSCULAR | Status: DC | PRN
  Administered 2019-05-12: 4 mg via INTRAVENOUS

## 2019-05-12 MED ORDER — FENTANYL CITRATE (PF) 100 MCG/2ML IJ SOLN
25.0000 ug | INTRAMUSCULAR | Status: DC | PRN
  Filled 2019-05-12: qty 1

## 2019-05-12 MED ORDER — MIDAZOLAM HCL 2 MG/2ML IJ SOLN
INTRAMUSCULAR | Status: DC | PRN
  Administered 2019-05-12: 2 mg via INTRAVENOUS

## 2019-05-12 MED ORDER — METOCLOPRAMIDE HCL 5 MG/ML IJ SOLN
10.0000 mg | Freq: Once | INTRAMUSCULAR | Status: DC | PRN
  Filled 2019-05-12: qty 2

## 2019-05-12 MED ORDER — LACTATED RINGERS IV SOLN
INTRAVENOUS | Status: DC
  Administered 2019-05-12: 50 mL/h via INTRAVENOUS
  Filled 2019-05-12 (×2): qty 1000

## 2019-05-12 MED ORDER — OXYCODONE HCL 5 MG PO TABS
5.0000 mg | ORAL_TABLET | Freq: Once | ORAL | Status: AC | PRN
  Administered 2019-05-12: 5 mg via ORAL
  Filled 2019-05-12: qty 1

## 2019-05-12 MED ORDER — ACETAMINOPHEN 325 MG PO TABS
650.0000 mg | ORAL_TABLET | ORAL | Status: DC | PRN
  Filled 2019-05-12: qty 2

## 2019-05-12 MED ORDER — BUPIVACAINE-EPINEPHRINE (PF) 0.25% -1:200000 IJ SOLN
INTRAMUSCULAR | Status: DC | PRN
  Administered 2019-05-12: 30 mL

## 2019-05-12 MED ORDER — LIDOCAINE 2% (20 MG/ML) 5 ML SYRINGE
INTRAMUSCULAR | Status: DC | PRN
  Administered 2019-05-12: 25 mg via INTRAVENOUS

## 2019-05-12 MED ORDER — OXYCODONE HCL 5 MG/5ML PO SOLN
5.0000 mg | Freq: Once | ORAL | Status: AC | PRN
  Filled 2019-05-12: qty 5

## 2019-05-12 MED ORDER — DEXAMETHASONE SODIUM PHOSPHATE 10 MG/ML IJ SOLN
INTRAMUSCULAR | Status: AC
  Filled 2019-05-12: qty 1

## 2019-05-12 MED ORDER — OXYCODONE HCL 5 MG PO TABS
ORAL_TABLET | ORAL | Status: AC
  Filled 2019-05-12: qty 1

## 2019-05-12 MED ORDER — TRAMADOL HCL 50 MG PO TABS: 50.0000 mg | ORAL_TABLET | Freq: Four times a day (QID) | ORAL | 0 refills | Status: DC | PRN

## 2019-05-12 MED ORDER — FENTANYL CITRATE (PF) 100 MCG/2ML IJ SOLN
INTRAMUSCULAR | Status: AC
  Filled 2019-05-12: qty 2

## 2019-05-12 MED ORDER — ACETIC ACID 5 % SOLN
Status: DC | PRN
  Administered 2019-05-12: 1 via TOPICAL

## 2019-05-12 MED ORDER — MIDAZOLAM HCL 2 MG/2ML IJ SOLN
INTRAMUSCULAR | Status: AC
  Filled 2019-05-12: qty 2

## 2019-05-12 MED ORDER — PROPOFOL 500 MG/50ML IV EMUL
INTRAVENOUS | Status: DC | PRN
  Administered 2019-05-12: 200 ug/kg/min via INTRAVENOUS

## 2019-05-12 MED ORDER — ONDANSETRON HCL 4 MG/2ML IJ SOLN
INTRAMUSCULAR | Status: AC
  Filled 2019-05-12: qty 2

## 2019-05-12 MED ORDER — LIDOCAINE 2% (20 MG/ML) 5 ML SYRINGE
INTRAMUSCULAR | Status: AC
  Filled 2019-05-12: qty 5

## 2019-05-12 MED ORDER — SODIUM CHLORIDE 0.9 % IV SOLN
250.0000 mL | INTRAVENOUS | Status: DC | PRN
  Filled 2019-05-12: qty 250

## 2019-05-12 MED ORDER — PROPOFOL 10 MG/ML IV BOLUS
INTRAVENOUS | Status: AC
  Filled 2019-05-12: qty 20

## 2019-05-12 MED ORDER — OXYCODONE HCL 5 MG PO TABS
5.0000 mg | ORAL_TABLET | ORAL | Status: DC | PRN
  Filled 2019-05-12: qty 2

## 2019-05-12 MED ORDER — TRAMADOL HCL 50 MG PO TABS
50.0000 mg | ORAL_TABLET | Freq: Once | ORAL | Status: DC
  Filled 2019-05-12: qty 1

## 2019-05-12 MED ORDER — KETAMINE INJECTION FOR OPTIME (MG/KG/HR) DOCUMENTATION
INTRAVENOUS | Status: DC | PRN
  Administered 2019-05-12: .2 mg via INTRAVENOUS

## 2019-05-12 MED ORDER — KETOROLAC TROMETHAMINE 30 MG/ML IJ SOLN
INTRAMUSCULAR | Status: AC
  Filled 2019-05-12: qty 1

## 2019-05-12 MED ORDER — KETAMINE HCL 10 MG/ML IJ SOLN
INTRAMUSCULAR | Status: AC
  Filled 2019-05-12: qty 1

## 2019-05-12 MED ORDER — PROPOFOL 500 MG/50ML IV EMUL
INTRAVENOUS | Status: AC
  Filled 2019-05-12: qty 50

## 2019-05-12 MED ORDER — SODIUM CHLORIDE 0.9% FLUSH
3.0000 mL | INTRAVENOUS | Status: DC | PRN
  Filled 2019-05-12: qty 3

## 2019-05-12 MED ORDER — SODIUM CHLORIDE 0.9% FLUSH
3.0000 mL | Freq: Two times a day (BID) | INTRAVENOUS | Status: DC
  Filled 2019-05-12: qty 3

## 2019-05-12 SURGICAL SUPPLY — 36 items
APL SKNCLS STERI-STRIP NONHPOA (GAUZE/BANDAGES/DRESSINGS) ×1
APL SWBSTK 6 STRL LF DISP (MISCELLANEOUS)
APPLICATOR COTTON TIP 6 STRL (MISCELLANEOUS) IMPLANT
APPLICATOR COTTON TIP 6IN STRL (MISCELLANEOUS)
BENZOIN TINCTURE PRP APPL 2/3 (GAUZE/BANDAGES/DRESSINGS) ×2 IMPLANT
BLADE EXTENDED COATED 6.5IN (ELECTRODE) IMPLANT
BLADE HEX COATED 2.75 (ELECTRODE) ×2 IMPLANT
BLADE SURG 10 STRL SS (BLADE) ×2 IMPLANT
BRIEF STRETCH FOR OB PAD LRG (UNDERPADS AND DIAPERS) ×2 IMPLANT
CANISTER SUCT 3000ML PPV (MISCELLANEOUS) ×2 IMPLANT
COVER BACK TABLE 60X90IN (DRAPES) ×2 IMPLANT
COVER WAND RF STERILE (DRAPES) ×4 IMPLANT
DECANTER SPIKE VIAL GLASS SM (MISCELLANEOUS) ×2 IMPLANT
DRAPE LAPAROTOMY 100X72 PEDS (DRAPES) ×2 IMPLANT
DRAPE UTILITY XL STRL (DRAPES) ×2 IMPLANT
DRSG PAD ABDOMINAL 8X10 ST (GAUZE/BANDAGES/DRESSINGS) IMPLANT
ELECT REM PT RETURN 9FT ADLT (ELECTROSURGICAL) ×2
ELECTRODE REM PT RTRN 9FT ADLT (ELECTROSURGICAL) ×1 IMPLANT
GAUZE SPONGE 4X4 12PLY STRL (GAUZE/BANDAGES/DRESSINGS) IMPLANT
GLOVE BIO SURGEON STRL SZ 6.5 (GLOVE) ×2 IMPLANT
KIT SIGMOIDOSCOPE (SET/KITS/TRAYS/PACK) IMPLANT
KIT TURNOVER CYSTO (KITS) ×2 IMPLANT
NEEDLE HYPO 22GX1.5 SAFETY (NEEDLE) ×2 IMPLANT
NS IRRIG 500ML POUR BTL (IV SOLUTION) ×2 IMPLANT
PACK BASIN DAY SURGERY FS (CUSTOM PROCEDURE TRAY) ×2 IMPLANT
PAD ARMBOARD 7.5X6 YLW CONV (MISCELLANEOUS) IMPLANT
PENCIL BUTTON HOLSTER BLD 10FT (ELECTRODE) ×2 IMPLANT
SPONGE SURGIFOAM ABS GEL 12-7 (HEMOSTASIS) IMPLANT
SUT CHROMIC 2 0 SH (SUTURE) IMPLANT
SUT CHROMIC 3 0 SH 27 (SUTURE) IMPLANT
SYR BULB IRRIGATION 50ML (SYRINGE) ×2 IMPLANT
SYR CONTROL 10ML LL (SYRINGE) ×2 IMPLANT
TOWEL OR 17X26 10 PK STRL BLUE (TOWEL DISPOSABLE) ×2 IMPLANT
TRAY DSU PREP LF (CUSTOM PROCEDURE TRAY) ×2 IMPLANT
TUBE CONNECTING 12X1/4 (SUCTIONS) ×2 IMPLANT
YANKAUER SUCT BULB TIP NO VENT (SUCTIONS) IMPLANT

## 2019-05-12 NOTE — Discharge Instructions (Addendum)

## 2019-05-12 NOTE — H&P (Signed)
The patient is a 54 year old female who presents with anal lesions. 54 year old female who presented to her GI doctor with complaints of irregular bowel habits and some rectal bleeding while wiping. She underwent a colonoscopy which showed an ascending colon polyp and a polypoid lesion in the distal rectum. 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 She does undergo cervical cancer screenings and has not had any abnormal results.   Past Surgical History Sallyanne Kuster, CMA; 04/04/2019 9:59 AM) Appendectomy Breast Augmentation Bilateral. Colon Polyp Removal - Colonoscopy Gallbladder Surgery - Laparoscopic Shoulder Surgery Left. Tonsillectomy  Diagnostic Studies History Sallyanne Kuster, [place]; 04/04/2019 9:59 AM) Colonoscopy within last year Mammogram within last year Pap Smear 1-5 years ago  Allergies Sallyanne Kuster, [place]; 04/04/2019 10:02 AM) Abel Presto Tabs *GENITOURINARY AGENTS - MISCELLANEOUS* Flagyl *ANTI-INFECTIVE AGENTS - MISC.* Dye FDC Red 40 (Carmine Red) *PHARMACEUTICAL ADJUVANTS* Nitrofurantoin *URINARY ANTI-INFECTIVES* Lodine *ANALGESICS - ANTI-INFLAMMATORY* Allergies Reconciled  Medication History Sallyanne Kuster, CMA; 04/04/2019 10:03 AM) Phentermine HCl (37.5MG  Capsule, Oral) Active. Hyoscyamine Sulfate (0.125MG  Tab Sublingual, Sublingual) Active. Levothyroxine Sodium (88MCG Tablet, Oral) Active. Metoprolol Succinate ER (50MG  Tablet ER 24HR, Oral) Active. valACYclovir HCl (500MG  Tablet, Oral) Active. Medications Reconciled  Social History Sallyanne Kuster, CMA; 04/04/2019 9:59 AM) Caffeine use Coffee. No drug use  Family History Sallyanne Kuster, [place]; 04/04/2019 9:59 AM) Breast Cancer Family Members In General. Cervical Cancer Family Members In General. Colon Polyps Mother. Depression Daughter, Sister. Diabetes Mellitus Family Members In General. Heart Disease Family Members In General. Heart disease in female family member before age  44 Hypertension Family Members In General. Melanoma Father. Prostate Cancer Family Members In General. Respiratory Condition Son. Thyroid problems Family Members In General.  Pregnancy / Birth History  Age of menopause 73-50 Contraceptive History Oral contraceptives. Gravida 6 Length (months) of breastfeeding 12-24 Maternal age 15-35 Para 2  Other Problems  Back Pain Bladder Problems Cholelithiasis Depression Gastroesophageal Reflux Disease Hemorrhoids Other disease, cancer, significant illness Thyroid Disease     Review of Systems  General Not Present- Appetite Loss, Chills, Fatigue, Fever, Night Sweats, Weight Gain and Weight Loss. Skin Not Present- Change in Wart/Mole, Dryness, Hives, Jaundice, New Lesions, Non-Healing Wounds, Rash and Ulcer. HEENT Present- Wears glasses/contact lenses. Not Present- Earache, Hearing Loss, Hoarseness, Nose Bleed, Oral Ulcers, Ringing in the Ears, Seasonal Allergies, Sinus Pain, Sore Throat, Visual Disturbances and Yellow Eyes. Cardiovascular Not Present- Chest Pain, Difficulty Breathing Lying Down, Leg Cramps, Palpitations, Rapid Heart Rate, Shortness of Breath and Swelling of Extremities. Gastrointestinal Present- Abdominal Pain, Bloating and Hemorrhoids. Not Present- Bloody Stool, Change in Bowel Habits, Chronic diarrhea, Constipation, Difficulty Swallowing, Excessive gas, Gets full quickly at meals, Indigestion, Nausea, Rectal Pain and Vomiting. Female Genitourinary Not Present- Frequency, Nocturia, Painful Urination, Pelvic Pain and Urgency. Musculoskeletal Present- Muscle Pain. Not Present- Back Pain, Joint Pain, Joint Stiffness, Muscle Weakness and Swelling of Extremities. Neurological Not Present- Decreased Memory, Fainting, Headaches, Numbness, Seizures, Tingling, Tremor, Trouble walking and Weakness. Psychiatric Present- Depression. Not Present- Anxiety, Bipolar, Change in Sleep Pattern, Fearful and Frequent  crying. Endocrine Not Present- Cold Intolerance, Excessive Hunger, Hair Changes, Heat Intolerance, Hot flashes and New Diabetes. Hematology Present- Blood Thinners. Not Present- Easy Bruising, Excessive bleeding, Gland problems, HIV and Persistent Infections.  Ht 5' 2.5" (1.588 m)   Wt 57.6 kg   LMP 06/21/2015 (Approximate)   BMI 22.86 kg/m      Physical Exam  General Mental Status-Alert. General Appearance-Not in acute distress. Build & Nutrition-Well nourished. Posture-Normal posture. Gait-Normal.  Head  and Neck Head-normocephalic, atraumatic with no lesions or palpable masses. Trachea-midline.  Chest and Lung Exam Chest and lung exam reveals -on auscultation, normal breath sounds, no adventitious sounds and normal vocal resonance.  Cardiovascular Cardiovascular examination reveals -normal heart sounds, regular rate and rhythm with no murmurs and no digital clubbing, cyanosis, edema, increased warmth or tenderness.  Abdomen Inspection Inspection of the abdomen reveals - No Hernias. Palpation/Percussion Palpation and Percussion of the abdomen reveal - Soft, Non Tender, No Rigidity (guarding), No hepatosplenomegaly and No Palpable abdominal masses.  Rectal Anorectal Exam External - skin tag. Internal - normal internal exam.  Neurologic Neurologic evaluation reveals -alert and oriented x 3 with no impairment of recent or remote memory, normal attention span and ability to concentrate, normal sensation and normal coordination.  Musculoskeletal Normal Exam - Bilateral-Upper Extremity Strength Normal and Lower Extremity Strength Normal.   Name Value Date ANOSCOPY, DIAGNOSTIC [id number]) [ Hemorrhoids ] Procedure Other: Procedure: Anoscopy....Marland KitchenMarland KitchenSurgeon: Marcello Moores....Marland KitchenMarland KitchenAfter the risks and benefits were explained, verbal consent was obtained for above procedure. A medical assistant chaperone was present thoroughout the entire  procedure. ....Marland KitchenMarland KitchenAnesthesia: none....Marland KitchenMarland KitchenDiagnosis: rectal lesions....Marland KitchenMarland KitchenFindings: No lesions noted, grade 1 internal hemorrhoids, hard stool palpated in rectal vault.  Performed: 04/04/2019 10:22 AM    Assessment & Plan   AIN GRADE I [id number]) Impression: 53 year old female who underwent colonoscopy for occasional rectal bleeding presents to the office with complaints of AIN found in distal rectal lesions noted on that colonoscopy. On exam today I do not see any anal lesions. We discussed serial anoscopy in the office versus high-resolution anoscopy in the operating room. We discussed that neither option has been proven to be more effective than the other. After going over the risk and benefits of each option patient has agreed to proceed with high-resolution anoscopy. Hopefully this will give [place] more information about her risk. We discussed laser ablation if only a few lesions are seen. We discussed the need for increased surveillance if multiple areas were seen within the anal canal. We discussed the typical recovery time and postoperative pain.

## 2019-05-12 NOTE — Anesthesia Postprocedure Evaluation (Signed)
Anesthesia Post Note  Patient: MARISABEL MACPHERSON  Procedure(s) Performed: HIGH RESOLUTION ANOSCOPY, ANAL BIOPSY (N/A Rectum)     Patient location during evaluation: PACU Anesthesia Type: MAC Level of consciousness: awake and alert and oriented Pain management: pain level controlled Vital Signs Assessment: post-procedure vital signs reviewed and stable Respiratory status: spontaneous breathing, nonlabored ventilation and respiratory function stable Cardiovascular status: blood pressure returned to baseline and stable Postop Assessment: no apparent nausea or vomiting Anesthetic complications: no    Last Vitals:  Vitals:   05/12/19 0947 05/12/19 1050  BP: 123/67   Pulse: 69   Resp: 16   Temp: 36.4 C 36.8 C  SpO2: 100%     Last Pain:  Vitals:   05/12/19 1050  TempSrc:   PainSc: 0-No pain                 Augusto Deckman A.

## 2019-05-12 NOTE — Anesthesia Preprocedure Evaluation (Addendum)
Anesthesia Evaluation  Patient identified by MRN, date of birth, ID band Patient awake    Reviewed: Allergy & Precautions, NPO status , Patient's Chart, lab work & pertinent test results, reviewed documented beta blocker date and time   History of Anesthesia Complications (+) PONV and history of anesthetic complications  Airway Mallampati: II  TM Distance: >3 FB Neck ROM: Full    Dental no notable dental hx. (+) Teeth Intact, Dental Advisory Given   Pulmonary neg pulmonary ROS,    Pulmonary exam normal breath sounds clear to auscultation       Cardiovascular hypertension, Pt. on medications and Pt. on home beta blockers + Peripheral Vascular Disease  Normal cardiovascular exam Rhythm:Regular Rate:Normal     Neuro/Psych PSYCHIATRIC DISORDERS Depression negative neurological ROS     GI/Hepatic Neg liver ROS, GERD  Medicated and Controlled,AIN I   Endo/Other  Hypothyroidism   Renal/GU negative Renal ROS  negative genitourinary   Musculoskeletal negative musculoskeletal ROS (+)   Abdominal   Peds  Hematology negative hematology ROS (+)   Anesthesia Other Findings   Reproductive/Obstetrics HSV                            Anesthesia Physical Anesthesia Plan  ASA: II  Anesthesia Plan: MAC   Post-op Pain Management:    Induction: Intravenous  PONV Risk Score and Plan: 3 and Ondansetron, Treatment may vary due to age or medical condition, Midazolam, Propofol infusion and Dexamethasone  Airway Management Planned: Natural Airway, Nasal Cannula and Simple Face Mask  Additional Equipment:   Intra-op Plan:   Post-operative Plan:   Informed Consent: I have reviewed the patients History and Physical, chart, labs and discussed the procedure including the risks, benefits and alternatives for the proposed anesthesia with the patient or authorized representative who has indicated his/her  understanding and acceptance.     Dental advisory given  Plan Discussed with: CRNA and Surgeon  Anesthesia Plan Comments:         Anesthesia Quick Evaluation

## 2019-05-12 NOTE — Anesthesia Procedure Notes (Signed)
Procedure Name: MAC Date/Time: 05/12/2019 10:06 AM Performed by: Wanita Chamberlain, CRNA Pre-anesthesia Checklist: Patient identified, Timeout performed, Emergency Drugs available, Suction available and Patient being monitored Patient Re-evaluated:Patient Re-evaluated prior to induction Oxygen Delivery Method: Nasal cannula Preoxygenation: Pre-oxygenation with 100% oxygen Induction Type: IV induction Placement Confirmation: CO2 detector,  breath sounds checked- equal and bilateral and positive ETCO2 Dental Injury: Teeth and Oropharynx as per pre-operative assessment

## 2019-05-12 NOTE — Op Note (Signed)
05/12/2019  10:40 AM  PATIENT:  Kimberly Macdonald  54 y.o. female  Patient Care Team: Rutherford Guys, MD as PCP - General (Family Medicine) Brien Few, MD as Consulting Physician (Obstetrics and Gynecology)  PRE-OPERATIVE DIAGNOSIS:  ANAL INTRAEPITHELIAL NEOPLASM GRADE ONE  POST-OPERATIVE DIAGNOSIS:  ANAL INTRAEPITHELIAL NEOPLASM GRADE ONE  PROCEDURE:   HIGH RESOLUTION ANOSCOPY   Surgeon(s): Leighton Ruff, MD  ASSISTANT: none   ANESTHESIA:   local and MAC  EBL: 1 ml  No intake/output data recorded.  SPECIMEN:  Source of Specimen:  L lateral and posterior midline anal canal  DISPOSITION OF SPECIMEN:  PATHOLOGY  COUNTS:  YES  PLAN OF CARE: Discharge to home after PACU  PATIENT DISPOSITION:  PACU - hemodynamically stable.  INDICATION: 54 y.o. F with AIN noted on colonoscopy.  She elected to proceed with HRA  OR FINDINGS: lesion proximal to dentate line L lateral anal canal.  2 small lesions at posterior midline at dentate  DESCRIPTION: The patient was identified in the preoperative holding area and taken to the OR where they were laid prone on the operating room table in jack knife position. MAC anesthesia was smoothly induced.  The patient was then prepped and draped in the usual sterile fashion. A surgical timeout was performed indicating the correct patient, procedure, positioning and preoperative antibioitics. SCDs were noted to be in place and functioning prior to the operation.   After this was completed, a sponge was soaked in 5% acetic acid was placed over the perianal region. This was allowed to soak for 2 minutes. The sponge was removed and the perianal region was evaluated with a colposcope.  There were no concerning external lesions.  The internal anal canal was evaluated via anoscopy with a Hill-Ferguson anoscope.  There were 2 small (1-2 mm) lesions at posterior midline at the dentate line.  There was a larger lesion (4-5 mm) proximal to the dentate in the  left lateral anal canal.  These were both removed sharply and sent to pathology.  After this was completed, hemostasis was achieved with electrocautery and all of the biopsy sites were closed using a 3-0 chromic suture.  This concluded the procedure.    A sterile dressing was applied over anal opening. The patient was then awakened from anesthesia and sent to the postanesthesia care unit in stable condition. All counts were correct operating room staff.

## 2019-05-12 NOTE — Transfer of Care (Signed)
Immediate Anesthesia Transfer of Care Note  Patient: Kimberly Macdonald  Procedure(s) Performed: HIGH RESOLUTION ANOSCOPY (N/A Rectum)  Patient Location: PACU  Anesthesia Type:MAC  Level of Consciousness: awake, alert , oriented and patient cooperative  Airway & Oxygen Therapy: Patient Spontanous Breathing and Patient connected to nasal cannula oxygen  Post-op Assessment: Report given to RN and Post -op Vital signs reviewed and stable  Post vital signs: Reviewed and stable  Last Vitals:  Vitals Value Taken Time  BP    Temp    Pulse 114 05/12/19 1046  Resp 13 05/12/19 1046  SpO2 100 % 05/12/19 1046  Vitals shown include unvalidated device data.  Last Pain:  Vitals:   05/12/19 0947  TempSrc: Oral  PainSc: 0-No pain      Patients Stated Pain Goal: 4 (18/29/93 7169)  Complications: No apparent anesthesia complications

## 2019-05-13 LAB — SURGICAL PATHOLOGY

## 2019-05-16 ENCOUNTER — Other Ambulatory Visit: Payer: Self-pay | Admitting: General Surgery

## 2019-05-29 ENCOUNTER — Other Ambulatory Visit: Payer: Self-pay | Admitting: Family Medicine

## 2019-05-29 DIAGNOSIS — R000 Tachycardia, unspecified: Secondary | ICD-10-CM

## 2019-05-29 NOTE — Telephone Encounter (Signed)
Requested Prescriptions  Pending Prescriptions Disp Refills  . metoprolol succinate (TOPROL-XL) 50 MG 24 hr tablet [Pharmacy Med Name: METOPROLOL SUCC ER 50 MG TAB] 90 tablet 0    Sig: TAKE 1 TABLET BY MOUTH WITH OR IMMEDIATELY FOLLOWING A MEAL.     Cardiovascular:  Beta Blockers Passed - 05/29/2019  8:09 AM      Passed - Last BP in normal range    BP Readings from Last 1 Encounters:  05/12/19 139/65         Passed - Last Heart Rate in normal range    Pulse Readings from Last 1 Encounters:  05/12/19 68         Passed - Valid encounter within last 6 months    Recent Outpatient Visits          4 months ago Intermittent diarrhea   [facility], Lilia Argue, MD   9 months ago Tick bite of left scapular region, initial encounter   [facility] at [facility], Rex Kras, MD   9 months ago Special screening for malignant neoplasms, colon   [facility] at [place], [place]   1 year ago Tachycardia   [facility], [place] D, PA-C   2 years ago Chronic left-sided low back pain without sciatica   [facility] at [place], [place]

## 2019-07-12 DIAGNOSIS — B001 Herpesviral vesicular dermatitis: Secondary | ICD-10-CM | POA: Diagnosis not present

## 2019-07-12 DIAGNOSIS — Z6825 Body mass index (BMI) 25.0-25.9, adult: Secondary | ICD-10-CM | POA: Diagnosis not present

## 2019-07-12 DIAGNOSIS — E039 Hypothyroidism, unspecified: Secondary | ICD-10-CM | POA: Diagnosis not present

## 2019-07-12 DIAGNOSIS — Z01419 Encounter for gynecological examination (general) (routine) without abnormal findings: Secondary | ICD-10-CM | POA: Diagnosis not present

## 2019-07-12 DIAGNOSIS — Z124 Encounter for screening for malignant neoplasm of cervix: Secondary | ICD-10-CM | POA: Diagnosis not present

## 2019-07-13 DIAGNOSIS — H25041 Posterior subcapsular polar age-related cataract, right eye: Secondary | ICD-10-CM | POA: Diagnosis not present

## 2019-07-13 DIAGNOSIS — H43811 Vitreous degeneration, right eye: Secondary | ICD-10-CM | POA: Diagnosis not present

## 2019-07-13 DIAGNOSIS — H524 Presbyopia: Secondary | ICD-10-CM | POA: Diagnosis not present

## 2019-07-29 ENCOUNTER — Ambulatory Visit: Payer: BC Managed Care – PPO | Attending: Internal Medicine

## 2019-07-29 ENCOUNTER — Ambulatory Visit: Payer: BC Managed Care – PPO

## 2019-07-29 DIAGNOSIS — Z23 Encounter for immunization: Secondary | ICD-10-CM

## 2019-07-29 NOTE — Progress Notes (Signed)
   Covid-19 Vaccination Clinic  Name:  Kimberly Macdonald    MRN: [id number] DOB: 1966/02/10  07/29/2019  [name] was observed post Covid-19 immunization for 15 minutes without incident. She was provided with Vaccine Information Sheet and instruction to access the V-Safe system.   [name] was instructed to call 911 with any severe reactions post vaccine: Marland Kitchen Difficulty breathing  . Swelling of face and throat  . A fast heartbeat  . A bad rash all over body  . Dizziness and weakness   Immunizations Administered    Name Date Dose VIS Date Route   Pfizer COVID-19 Vaccine 07/29/2019  3:36 PM 0.3 mL 04/15/2019 Intramuscular   Manufacturer: [place]   Lot: [id number]   [place]: [id number]

## 2019-08-22 ENCOUNTER — Other Ambulatory Visit: Payer: Self-pay | Admitting: Family Medicine

## 2019-08-22 DIAGNOSIS — R000 Tachycardia, unspecified: Secondary | ICD-10-CM

## 2019-08-23 ENCOUNTER — Ambulatory Visit: Payer: BC Managed Care – PPO | Attending: Internal Medicine

## 2019-08-23 DIAGNOSIS — Z23 Encounter for immunization: Secondary | ICD-10-CM

## 2019-08-23 NOTE — Progress Notes (Signed)
   Covid-19 Vaccination Clinic  Name:  SHANTANA GRATTON    MRN: [id number] DOB: August 03, 1965  08/23/2019  Ms. Ayre was observed post Covid-19 immunization for 30 minutes based on pre-vaccination screening without incident. She was provided with Vaccine Information Sheet and instruction to access the V-Safe system.   Ms. Loree was instructed to call 911 with any severe reactions post vaccine: Marland Kitchen Difficulty breathing  . Swelling of face and throat  . A fast heartbeat  . A bad rash all over body  . Dizziness and weakness   Immunizations Administered    Name Date Dose VIS Date Route   Pfizer COVID-19 Vaccine 08/23/2019 11:47 AM 0.3 mL 06/29/2018 Intramuscular   Manufacturer: [place]   Lot: [id number]   [place]: [id number]

## 2019-12-02 ENCOUNTER — Other Ambulatory Visit: Payer: Self-pay | Admitting: Family Medicine

## 2019-12-02 DIAGNOSIS — R000 Tachycardia, unspecified: Secondary | ICD-10-CM

## 2019-12-02 NOTE — Telephone Encounter (Signed)
Requested  medications are  due for refill today yes  Requested medications are on the active medication list yes  Last refill 5/8  Last visit 8/19  Future visit scheduled no  Notes to clinic Failed protocol of visit within 6 months

## 2019-12-06 DIAGNOSIS — R928 Other abnormal and inconclusive findings on diagnostic imaging of breast: Secondary | ICD-10-CM | POA: Diagnosis not present

## 2019-12-06 DIAGNOSIS — R922 Inconclusive mammogram: Secondary | ICD-10-CM | POA: Diagnosis not present

## 2019-12-20 DIAGNOSIS — Z6825 Body mass index (BMI) 25.0-25.9, adult: Secondary | ICD-10-CM | POA: Diagnosis not present

## 2019-12-20 DIAGNOSIS — E663 Overweight: Secondary | ICD-10-CM | POA: Diagnosis not present

## 2020-02-02 DIAGNOSIS — Z6825 Body mass index (BMI) 25.0-25.9, adult: Secondary | ICD-10-CM | POA: Diagnosis not present

## 2020-02-02 DIAGNOSIS — E669 Obesity, unspecified: Secondary | ICD-10-CM | POA: Diagnosis not present

## 2020-02-13 DIAGNOSIS — G453 Amaurosis fugax: Secondary | ICD-10-CM | POA: Diagnosis not present

## 2020-02-26 NOTE — Progress Notes (Deleted)
Cardiology Office Note:    Date:  02/26/2020   ID:  Kimberly Macdonald, DOB 1965-10-26, MRN [id number]  PCP:  Rutherford Guys, MD (Inactive)  Cardiologist:  No primary care provider on file.  Electrophysiologist:  None   Referring MD: Rutherford Guys, MD   No chief complaint on file. ***  History of Present Illness:    Kimberly Macdonald is a 54 y.o. female with a hx of hypothyroidism who is referred by Dr. Pamella Pert for evaluation of tachycardia.  She previously followed with Dr. Debara Pickett, last seen in 2014.  She underwent MET test on 02/23/2012 which showed normal effort (peak VO2 73%), markedly accelerated heart rate at 97% predicted and a heart rate on VO2 curve showed a steep incline at anaerobic threshold with some flattening of the VO2 curve consistent with probable small vessel ischemia.  She was started on metoprolol.  Past Medical History:  Diagnosis Date  . Acne   . Anal intraepithelial neoplasia I   . Cataract    right eye, getting a second opinion  . Cervical dysplasia   . Depression   . Diverticulosis   . GERD (gastroesophageal reflux disease)    with pregnancy  . Heart palpitations    History of  . History of colon polyps   . History of iron deficiency anemia    as teenager  . History of rectal bleeding   . History of shingles   . History of vertigo   . Hypothyroidism   . Internal hemorrhoids   . Interstitial cystitis   . Low blood pressure   . PONV (postoperative nausea and vomiting)   . Poor circulation of extremity    bilateral lower  . Sinus tachycardia    History of  . Wears glasses     Past Surgical History:  Procedure Laterality Date  . APPENDECTOMY  1994  . BREAST ENHANCEMENT SURGERY    . Cardiometabolic Testing  62/1308   normal effort with peak VO2 of 73% predicted, HR accelerated at 97% predcited, HR on VO2 curve showed steep invline at anaerobic threshold - low risk, abnormal  . CERVICAL CONIZATION W/BX    . CHOLECYSTECTOMY    .  CYSTOSCOPY W/ DILATION OF BLADDER    . DILATION AND EVACUATION     miscarriage x3  . GYNECOLOGIC CRYOSURGERY    . HIGH RESOLUTION ANOSCOPY N/A 05/12/2019   Procedure: HIGH RESOLUTION ANOSCOPY, ANAL BIOPSY;  Surgeon: Leighton Ruff, MD;  Location: [facility];  Service: General;  Laterality: N/A;  . SHOULDER SURGERY Left    Muscle detachment  . TONSILLECTOMY    . TRANSTHORACIC ECHOCARDIOGRAM  03/2005   EF 55-65%, normal LV systolic function - no LV regional wall motion abnormalities  . VAGINAL WOUND CLOSURE / REPAIR     after vaginal delivery  . WISDOM TOOTH EXTRACTION      Current Medications: No outpatient medications have been marked as taking for the 02/29/20 encounter (Appointment) with Donato Heinz, MD.     Allergies:   Azo [phenazopyridine hcl], Flagyl [metronidazole], Nitrofurantoin monohyd macro, Red dye, Lodine [etodolac], and Macrolides and ketolides   Social History   Socioeconomic History  . Marital status: Married    Spouse name: Not on file  . Number of children: 3  . Years of education: 54yrcoll.  . Highest education level: Not on file  Occupational History  . Occupation: singer/actor  Tobacco Use  . Smoking status: Never Smoker  . Smokeless tobacco:  Never Used  Vaping Use  . Vaping Use: Never used  Substance and Sexual Activity  . Alcohol use: Yes    Comment: occasionally (twice yearly)  . Drug use: No  . Sexual activity: Not Currently    Partners: Male    Birth control/protection: Post-menopausal  Other Topics Concern  . Not on file  Social History Narrative  . Not on file   Social Determinants of Health   Financial Resource Strain:   . Difficulty of Paying Living Expenses: Not on file  Food Insecurity:   . Worried About Charity fundraiser in the Last Year: Not on file  . Ran Out of Food in the Last Year: Not on file  Transportation Needs:   . Lack of Transportation (Medical): Not on file  . Lack of Transportation  (Non-Medical): Not on file  Physical Activity:   . Days of Exercise per Week: Not on file  . Minutes of Exercise per Session: Not on file  Stress:   . Feeling of Stress : Not on file  Social Connections:   . Frequency of Communication with Friends and Family: Not on file  . Frequency of Social Gatherings with Friends and Family: Not on file  . Attends Religious Services: Not on file  . Active Member of Clubs or Organizations: Not on file  . Attends Archivist Meetings: Not on file  . Marital Status: Not on file     Family History: The patient's ***family history includes Breast cancer in her maternal grandmother; Colon polyps in her mother; Depression in her sister; Diverticulitis in her mother; Heart disease in her paternal grandfather and paternal grandmother; Hyperlipidemia in her father; Hypertension in her father and mother. There is no history of Colon cancer, Esophageal cancer, or Rectal cancer.  ROS:   Please see the history of present illness.    *** All other systems reviewed and are negative.  EKGs/Labs/Other Studies Reviewed:    The following studies were reviewed today: ***  EKG:  EKG is *** ordered today.  The ekg ordered today demonstrates ***  Recent Labs: 05/12/2019: BUN 15; Creatinine, Ser 0.60; Hemoglobin 12.9; Potassium 3.8; Sodium 141  Recent Lipid Panel    Component Value Date/Time   CHOL 192 06/09/2011 1610   TRIG 198 (H) 06/09/2011 1610   HDL 61 06/09/2011 1610   CHOLHDL 3.1 06/09/2011 1610   VLDL 40 06/09/2011 1610   LDLCALC 91 06/09/2011 1610    Physical Exam:    VS:  LMP 06/21/2015 (Approximate)     Wt Readings from Last 3 Encounters:  05/12/19 135 lb 14.4 oz (61.6 kg)  03/16/19 133 lb (60.3 kg)  02/09/19 131 lb (59.4 kg)     GEN: *** Well nourished, well developed in no acute distress HEENT: Normal NECK: No JVD; No carotid bruits LYMPHATICS: No lymphadenopathy CARDIAC: ***RRR, no murmurs, rubs, gallops RESPIRATORY:  Clear to  auscultation without rales, wheezing or rhonchi  ABDOMEN: Soft, non-tender, non-distended MUSCULOSKELETAL:  No edema; No deformity  SKIN: Warm and dry NEUROLOGIC:  Alert and oriented x 3 PSYCHIATRIC:  Normal affect   ASSESSMENT:    No diagnosis found. PLAN:       Medication Adjustments/Labs and Tests Ordered: Current medicines are reviewed at length with the patient today.  Concerns regarding medicines are outlined above.  No orders of the defined types were placed in this encounter.  No orders of the defined types were placed in this encounter.   There are no Patient  Instructions on file for this visit.   Signed, Donato Heinz, MD  02/26/2020 3:42 PM    [place]

## 2020-02-29 ENCOUNTER — Encounter: Payer: Self-pay | Admitting: *Deleted

## 2020-02-29 ENCOUNTER — Encounter: Payer: Self-pay | Admitting: Cardiology

## 2020-02-29 ENCOUNTER — Ambulatory Visit: Payer: BC Managed Care – PPO | Admitting: Cardiology

## 2020-02-29 ENCOUNTER — Other Ambulatory Visit: Payer: Self-pay

## 2020-02-29 VITALS — BP 105/75 | HR 81 | Ht 62.5 in | Wt 127.4 lb

## 2020-02-29 DIAGNOSIS — R002 Palpitations: Secondary | ICD-10-CM

## 2020-02-29 DIAGNOSIS — G453 Amaurosis fugax: Secondary | ICD-10-CM

## 2020-02-29 DIAGNOSIS — Z13220 Encounter for screening for lipoid disorders: Secondary | ICD-10-CM | POA: Diagnosis not present

## 2020-02-29 NOTE — Progress Notes (Signed)
Cardiology Office Note:    Date:  02/29/2020   ID:  Kimberly Macdonald, DOB Jun 16, 1965, MRN [id number]  PCP:  Rutherford Guys, MD (Inactive)  Cardiologist:  No primary care provider on file.  Electrophysiologist:  None   Referring MD: Rutherford Guys, MD   Chief Complaint  Patient presents with  . Loss of Vision    History of Present Illness:    Kimberly Macdonald is a 54 y.o. female with a hx of hypothyroidism who is referred by Dr. Pamella Pert for evaluation of tachycardia.  She previously followed with Dr. Debara Pickett, last seen in 2014.  She underwent MET test on 02/23/2012 which showed normal effort (peak VO2 73%), markedly accelerated heart rate at 97% predicted and a heart rate on VO2 curve showed a steep incline at anaerobic threshold with some flattening of the VO2 curve consistent with probable small vessel ischemia.  She was started on metoprolol.  Reports that she continues to take metoprolol and palpitations are under control while on the medication.  She denies any chest pain or dyspnea.  Reports she exercises by boxing regularly, lifting weights, and doing the elliptical.  Does report intermittent lightheadedness with standing.  Denies any syncope.  Reports 2 weeks ago had transient vision loss in her right eye.  Vision returned in 3 to 5 minutes.    Past Medical History:  Diagnosis Date  . Acne   . Anal intraepithelial neoplasia I   . Cataract    right eye, getting a second opinion  . Cervical dysplasia   . Depression   . Diverticulosis   . GERD (gastroesophageal reflux disease)    with pregnancy  . Heart palpitations    History of  . History of colon polyps   . History of iron deficiency anemia    as teenager  . History of rectal bleeding   . History of shingles   . History of vertigo   . Hypothyroidism   . Internal hemorrhoids   . Interstitial cystitis   . Low blood pressure   . PONV (postoperative nausea and vomiting)   . Poor circulation of extremity     bilateral lower  . Sinus tachycardia    History of  . Wears glasses     Past Surgical History:  Procedure Laterality Date  . APPENDECTOMY  1994  . BREAST ENHANCEMENT SURGERY    . Cardiometabolic Testing  58/8325   normal effort with peak VO2 of 73% predicted, HR accelerated at 97% predcited, HR on VO2 curve showed steep invline at anaerobic threshold - low risk, abnormal  . CERVICAL CONIZATION W/BX    . CHOLECYSTECTOMY    . CYSTOSCOPY W/ DILATION OF BLADDER    . DILATION AND EVACUATION     miscarriage x3  . GYNECOLOGIC CRYOSURGERY    . HIGH RESOLUTION ANOSCOPY N/A 05/12/2019   Procedure: HIGH RESOLUTION ANOSCOPY, ANAL BIOPSY;  Surgeon: Leighton Ruff, MD;  Location: [facility];  Service: General;  Laterality: N/A;  . SHOULDER SURGERY Left    Muscle detachment  . TONSILLECTOMY    . TRANSTHORACIC ECHOCARDIOGRAM  03/2005   EF 55-65%, normal LV systolic function - no LV regional wall motion abnormalities  . VAGINAL WOUND CLOSURE / REPAIR     after vaginal delivery  . WISDOM TOOTH EXTRACTION      Current Medications: Current Meds  Medication Sig  . b complex vitamins tablet Take 1 tablet by mouth daily.  Marland Kitchen co-enzyme Q-10 30 MG capsule  Take 300 mg by mouth daily.   Marland Kitchen L-Arginine 1000 MG TABS Take by mouth.   . Lactobacillus (PROBIOTIC ACIDOPHILUS PO) Take by mouth.  . levothyroxine (SYNTHROID, LEVOTHROID) 88 MCG tablet Take 88 mcg by mouth every evening.   . metoprolol succinate (TOPROL-XL) 50 MG 24 hr tablet TAKE 1 TABLET BY MOUTH WITH OR IMMEDIATELY FOLLOWING A MEAL. (Patient taking differently: Take 50 mg by mouth. TAKE 1 TABLET BY MOUTH WITH OR IMMEDIATELY FOLLOWING A MEAL. Pt takes at night time.)  . Multiple Vitamin (MULTIVITAMIN) tablet Take 1 tablet by mouth daily.  . valACYclovir (VALTREX) 500 MG tablet      Allergies:   Azo [phenazopyridine hcl], Flagyl [metronidazole], Nitrofurantoin monohyd macro, Red dye, Lodine [etodolac], and Macrolides and ketolides    Social History   Socioeconomic History  . Marital status: Married    Spouse name: Not on file  . Number of children: 3  . Years of education: 54yrcoll.  . Highest education level: Not on file  Occupational History  . Occupation: singer/actor  Tobacco Use  . Smoking status: Never Smoker  . Smokeless tobacco: Never Used  Vaping Use  . Vaping Use: Never used  Substance and Sexual Activity  . Alcohol use: Yes    Comment: occasionally (twice yearly)  . Drug use: No  . Sexual activity: Not Currently    Partners: Male    Birth control/protection: Post-menopausal  Other Topics Concern  . Not on file  Social History Narrative  . Not on file   Social Determinants of Health   Financial Resource Strain:   . Difficulty of Paying Living Expenses: Not on file  Food Insecurity:   . Worried About RCharity fundraiserin the Last Year: Not on file  . Ran Out of Food in the Last Year: Not on file  Transportation Needs:   . Lack of Transportation (Medical): Not on file  . Lack of Transportation (Non-Medical): Not on file  Physical Activity:   . Days of Exercise per Week: Not on file  . Minutes of Exercise per Session: Not on file  Stress:   . Feeling of Stress : Not on file  Social Connections:   . Frequency of Communication with Friends and Family: Not on file  . Frequency of Social Gatherings with Friends and Family: Not on file  . Attends Religious Services: Not on file  . Active Member of Clubs or Organizations: Not on file  . Attends CArchivistMeetings: Not on file  . Marital Status: Not on file     Family History: The patient's family history includes Breast cancer in her maternal grandmother; Colon polyps in her mother; Depression in her sister; Diverticulitis in her mother; Heart disease in her paternal grandfather and paternal grandmother; Hyperlipidemia in her father; Hypertension in her father and mother. There is no history of Colon cancer, Esophageal cancer,  or Rectal cancer.  ROS:   Please see the history of present illness.     All other systems reviewed and are negative.  EKGs/Labs/Other Studies Reviewed:    The following studies were reviewed today:   EKG:  EKG is ordered today.  The ekg ordered today demonstrates normal sinus rhythm, rate 81, no ST/T abnormalities  Recent Labs: 05/12/2019: BUN 15; Creatinine, Ser 0.60; Hemoglobin 12.9; Potassium 3.8; Sodium 141  Recent Lipid Panel    Component Value Date/Time   CHOL 192 06/09/2011 1610   TRIG 198 (H) 06/09/2011 1610   HDL 61 06/09/2011  1610   CHOLHDL 3.1 06/09/2011 1610   VLDL 40 06/09/2011 1610   LDLCALC 91 06/09/2011 1610    Physical Exam:    VS:  BP 105/75 (BP Location: Left Arm, Patient Position: Sitting)   Pulse 81   Ht 5' 2.5" (1.588 m)   Wt 127 lb 6.4 oz (57.8 kg)   LMP 06/21/2015 (Approximate)   SpO2 99%   BMI 22.93 kg/m     Wt Readings from Last 3 Encounters:  02/29/20 127 lb 6.4 oz (57.8 kg)  05/12/19 135 lb 14.4 oz (61.6 kg)  03/16/19 133 lb (60.3 kg)     GEN:  Well nourished, well developed in no acute distress HEENT: Normal NECK: No JVD; No carotid bruits LYMPHATICS: No lymphadenopathy CARDIAC: RRR, no murmurs, rubs, gallops RESPIRATORY:  Clear to auscultation without rales, wheezing or rhonchi  ABDOMEN: Soft, non-tender, non-distended MUSCULOSKELETAL:  No edema; No deformity  SKIN: Warm and dry NEUROLOGIC:  Alert and oriented x 3 PSYCHIATRIC:  Normal affect   ASSESSMENT:    1. Amaurosis fugax   2. Palpitations   3. Lipid screening    PLAN:     Amaurosis fugax: Reported transient vision loss in right eye that lasted 3 to 5 minutes 2 weeks ago.  Reports she has been seen by her ophthalmologist.  Will check carotid duplex, echocardiogram, and Zio patch x2 weeks.  Check ESR/CRP.  Referred to neurology for further evaluation.  Tachycardia/palpitations.  Appears controlled on metoprolol, will continue  Lipid screening: No recent lipid panel,  will check  RTC in 3 months  Medication Adjustments/Labs and Tests Ordered: Current medicines are reviewed at length with the patient today.  Concerns regarding medicines are outlined above.  Orders Placed This Encounter  Procedures  . CBC  . Comprehensive metabolic panel  . Lipid panel  . TSH  . C-reactive protein  . Sedimentation rate  . Ambulatory referral to Neurology  . LONG TERM MONITOR (3-14 DAYS)  . EKG 12-Lead  . ECHOCARDIOGRAM COMPLETE  . VAS US CAROTID   No orders of the defined types were placed in this encounter.   Patient Instructions  Medication Instructions:  Your physician recommends that you continue on your current medications as directed. Please refer to the Current Medication list given to you today.  *If you need a refill on your cardiac medications before your next appointment, please call your pharmacy*   Lab Work: CMET, CBC, TSH, Lipid, ESR, CRP today  If you have labs (blood work) drawn today and your tests are completely normal, you will receive your results only by: Marland Kitchen MyChart Message (if you have MyChart) OR . A paper copy in the mail If you have any lab test that is abnormal or we need to change your treatment, we will call you to review the results.   Testing/Procedures: Your physician has requested that you have a carotid duplex. This test is an ultrasound of the carotid arteries in your neck. It looks at blood flow through these arteries that supply the brain with blood. Allow one hour for this exam. There are no restrictions or special instructions.  Your physician has requested that you have an echocardiogram. Echocardiography is a painless test that uses sound waves to create images of your heart. It provides your doctor with information about the size and shape of your heart and how well your heart's chambers and valves are working. This procedure takes approximately one hour. There are no restrictions for this procedure.   ZIO  XT- Long  Term Monitor Instructions   Your physician has requested you wear your ZIO patch monitor 14 days.   This is a single patch monitor.  [employer] supplies one patch monitor per enrollment.  Additional stickers are not available.   Please do not apply patch if you will be having a Nuclear Stress Test, Echocardiogram, Cardiac CT, MRI, or Chest Xray during the time frame you would be wearing the monitor. The patch cannot be worn during these tests.  You cannot remove and re-apply the ZIO XT patch monitor.   Your ZIO patch monitor will be sent USPS Priority mail from [facility] directly to your home address. The monitor may also be mailed to a PO BOX if home delivery is not available.   It may take 3-5 days to receive your monitor after you have been enrolled.   Once you have received you monitor, please review enclosed instructions.  Your monitor has already been registered assigning a specific monitor serial # to you.   Applying the monitor   Shave hair from upper left chest.   Hold abrader disc by orange tab.  Rub abrader in 40 strokes over left upper chest as indicated in your monitor instructions.   Clean area with 4 enclosed alcohol pads .  Use all pads to assure are is cleaned thoroughly.  Let dry.   Apply patch as indicated in monitor instructions.  Patch will be place under collarbone on left side of chest with arrow pointing upward.   Rub patch adhesive wings for 2 minutes.Remove white label marked "1".  Remove white label marked "2".  Rub patch adhesive wings for 2 additional minutes.   While looking in a mirror, press and release button in center of patch.  A small green light will flash 3-4 times .  This will be your only indicator the monitor has been turned on.     Do not shower for the first 24 hours.  You may shower after the first 24 hours.   Press button if you feel a symptom. You will hear a small click.  Record Date, Time and Symptom in the Patient Log Book.   When  you are ready to remove patch, follow instructions on last 2 pages of Patient Log Book.  Stick patch monitor onto last page of Patient Log Book.   Place Patient Log Book in [place] box.  Use locking tab on box and tape box closed securely.  The [employer] has [employer] on it.  Please place in mailbox as soon as possible.  Your physician should have your test results approximately 7 days after the monitor has been mailed back to [facility].   Call [facility] at [phone] if you have questions regarding your ZIO XT patch monitor.  Call them immediately if you see an orange light blinking on your monitor.   If your monitor falls off in less than 4 days contact our Monitor department at [phone].  If your monitor becomes loose or falls off after 4 days call [employer] at [phone] for suggestions on securing your monitor.     Follow-Up: At [facility], you and your health needs are our priority.  As part of our continuing mission to provide you with exceptional heart care, we have created designated Provider Care Teams.  These Care Teams include your primary Cardiologist (physician) and Advanced Practice Providers (APPs -  Physician Assistants and Nurse Practitioners) who all work together to provide you  with the care you need, when you need it.  We recommend signing up for the patient portal called "MyChart".  Sign up information is provided on this After Visit Summary.  MyChart is used to connect with patients for Virtual Visits (Telemedicine).  Patients are able to view lab/test results, encounter notes, upcoming appointments, etc.  Non-urgent messages can be sent to your provider as well.   To learn more about what you can do with MyChart, go to [email or web address].    Your next appointment:   3 month(s)  The format for your next appointment:   In Person  Provider:   Oswaldo Milian, MD   Other Instructions You have been referred to  Neurology      Signed, Donato Heinz, MD  02/29/2020 10:11 PM    [place]

## 2020-02-29 NOTE — Progress Notes (Signed)
Patient ID: Kimberly Macdonald, female   DOB: 12-17-1965, 54 y.o.   MRN: [id number] Patient enrolled for [employer] to ship a 14 ZIO XT long term holter monitor to her home.

## 2020-02-29 NOTE — Patient Instructions (Signed)
Medication Instructions:  Your physician recommends that you continue on your current medications as directed. Please refer to the Current Medication list given to you today.  *If you need a refill on your cardiac medications before your next appointment, please call your pharmacy*   Lab Work: CMET, CBC, TSH, Lipid, ESR, CRP today  If you have labs (blood work) drawn today and your tests are completely normal, you will receive your results only by: Marland Kitchen MyChart Message (if you have MyChart) OR . A paper copy in the mail If you have any lab test that is abnormal or we need to change your treatment, we will call you to review the results.   Testing/Procedures: Your physician has requested that you have a carotid duplex. This test is an ultrasound of the carotid arteries in your neck. It looks at blood flow through these arteries that supply the brain with blood. Allow one hour for this exam. There are no restrictions or special instructions.  Your physician has requested that you have an echocardiogram. Echocardiography is a painless test that uses sound waves to create images of your heart. It provides your doctor with information about the size and shape of your heart and how well your heart's chambers and valves are working. This procedure takes approximately one hour. There are no restrictions for this procedure.   ZIO XT- Long Term Monitor Instructions   Your physician has requested you wear your ZIO patch monitor 14 days.   This is a single patch monitor.  [employer] supplies one patch monitor per enrollment.  Additional stickers are not available.   Please do not apply patch if you will be having a Nuclear Stress Test, Echocardiogram, Cardiac CT, MRI, or Chest Xray during the time frame you would be wearing the monitor. The patch cannot be worn during these tests.  You cannot remove and re-apply the ZIO XT patch monitor.   Your ZIO patch monitor will be sent USPS Priority mail from  [facility] directly to your home address. The monitor may also be mailed to a PO BOX if home delivery is not available.   It may take 3-5 days to receive your monitor after you have been enrolled.   Once you have received you monitor, please review enclosed instructions.  Your monitor has already been registered assigning a specific monitor serial # to you.   Applying the monitor   Shave hair from upper left chest.   Hold abrader disc by orange tab.  Rub abrader in 40 strokes over left upper chest as indicated in your monitor instructions.   Clean area with 4 enclosed alcohol pads .  Use all pads to assure are is cleaned thoroughly.  Let dry.   Apply patch as indicated in monitor instructions.  Patch will be place under collarbone on left side of chest with arrow pointing upward.   Rub patch adhesive wings for 2 minutes.Remove white label marked "1".  Remove white label marked "2".  Rub patch adhesive wings for 2 additional minutes.   While looking in a mirror, press and release button in center of patch.  A small green light will flash 3-4 times .  This will be your only indicator the monitor has been turned on.     Do not shower for the first 24 hours.  You may shower after the first 24 hours.   Press button if you feel a symptom. You will hear a small click.  Record Date, Time and Symptom in  the Patient Log Book.   When you are ready to remove patch, follow instructions on last 2 pages of Patient Log Book.  Stick patch monitor onto last page of Patient Log Book.   Place Patient Log Book in [place] box.  Use locking tab on box and tape box closed securely.  The [employer] has [employer] on it.  Please place in mailbox as soon as possible.  Your physician should have your test results approximately 7 days after the monitor has been mailed back to [facility].   Call [place] at [phone] if you have questions regarding your ZIO XT patch  monitor.  Call them immediately if you see an orange light blinking on your monitor.   If your monitor falls off in less than 4 days contact our Monitor department at [phone].  If your monitor becomes loose or falls off after 4 days call [employer] at [phone] for suggestions on securing your monitor.     Follow-Up: At [facility], you and your health needs are our priority.  As part of our continuing mission to provide you with exceptional heart care, we have created designated Provider Care Teams.  These Care Teams include your primary Cardiologist (physician) and Advanced Practice Providers (APPs -  Physician Assistants and Nurse Practitioners) who all work together to provide you with the care you need, when you need it.  We recommend signing up for the patient portal called "MyChart".  Sign up information is provided on this After Visit Summary.  MyChart is used to connect with patients for Virtual Visits (Telemedicine).  Patients are able to view lab/test results, encounter notes, upcoming appointments, etc.  Non-urgent messages can be sent to your provider as well.   To learn more about what you can do with MyChart, go to [email or web address].    Your next appointment:   3 month(s)  The format for your next appointment:   In Person  Provider:   Oswaldo Milian, MD   Other Instructions You have been referred to Neurology

## 2020-03-01 LAB — COMPREHENSIVE METABOLIC PANEL
ALT: 15 IU/L (ref 0–32)
AST: 19 IU/L (ref 0–40)
Albumin/Globulin Ratio: 2 (ref 1.2–2.2)
Albumin: 4.7 g/dL (ref 3.8–4.9)
Alkaline Phosphatase: 100 IU/L (ref 44–121)
BUN/Creatinine Ratio: 20 (ref 9–23)
BUN: 15 mg/dL (ref 6–24)
Bilirubin Total: 0.8 mg/dL (ref 0.0–1.2)
CO2: 30 mmol/L — ABNORMAL HIGH (ref 20–29)
Calcium: 10.2 mg/dL (ref 8.7–10.2)
Chloride: 103 mmol/L (ref 96–106)
Creatinine, Ser: 0.74 mg/dL (ref 0.57–1.00)
GFR calc Af Amer: 107 mL/min/{1.73_m2} (ref >59)
GFR calc non Af Amer: 93 mL/min/{1.73_m2} (ref >59)
Globulin, Total: 2.3 g/dL (ref 1.5–4.5)
Glucose: 85 mg/dL (ref 65–99)
Potassium: 4.4 mmol/L (ref 3.5–5.2)
Sodium: 146 mmol/L — ABNORMAL HIGH (ref 134–144)
Total Protein: 7 g/dL (ref 6.0–8.5)

## 2020-03-01 LAB — LIPID PANEL
Chol/HDL Ratio: 2.5 ratio (ref 0.0–4.4)
Cholesterol, Total: 180 mg/dL (ref 100–199)
HDL: 72 mg/dL (ref >39)
LDL Chol Calc (NIH): 81 mg/dL (ref 0–99)
Triglycerides: 163 mg/dL — ABNORMAL HIGH (ref 0–149)
VLDL Cholesterol Cal: 27 mg/dL (ref 5–40)

## 2020-03-01 LAB — TSH: TSH: 1.39 u[IU]/mL (ref 0.450–4.500)

## 2020-03-01 LAB — CBC
Hematocrit: 42.7 % (ref 34.0–46.6)
Hemoglobin: 14.4 g/dL (ref 11.1–15.9)
MCH: 29.6 pg (ref 26.6–33.0)
MCHC: 33.7 g/dL (ref 31.5–35.7)
MCV: 88 fL (ref 79–97)
Platelets: 324 10*3/uL (ref 150–450)
RBC: 4.87 x10E6/uL (ref 3.77–5.28)
RDW: 12.9 % (ref 11.7–15.4)
WBC: 7.9 10*3/uL (ref 3.4–10.8)

## 2020-03-01 LAB — SEDIMENTATION RATE: Sed Rate: 19 mm/hr (ref 0–40)

## 2020-03-01 LAB — C-REACTIVE PROTEIN: CRP: 44 mg/L — ABNORMAL HIGH (ref 0–10)

## 2020-03-05 ENCOUNTER — Ambulatory Visit (INDEPENDENT_AMBULATORY_CARE_PROVIDER_SITE_OTHER): Payer: BC Managed Care – PPO

## 2020-03-05 ENCOUNTER — Encounter: Payer: Self-pay | Admitting: Neurology

## 2020-03-05 DIAGNOSIS — R002 Palpitations: Secondary | ICD-10-CM

## 2020-03-05 DIAGNOSIS — G453 Amaurosis fugax: Secondary | ICD-10-CM

## 2020-03-06 ENCOUNTER — Ambulatory Visit (HOSPITAL_COMMUNITY)
Admission: RE | Admit: 2020-03-06 | Discharge: 2020-03-06 | Disposition: A | Discharge status: Home / Self Care | Payer: BC Managed Care – PPO | Source: Ambulatory Visit | Attending: Cardiovascular Disease | Admitting: Cardiovascular Disease

## 2020-03-06 ENCOUNTER — Telehealth: Payer: Self-pay | Admitting: Cardiology

## 2020-03-06 ENCOUNTER — Other Ambulatory Visit: Payer: Self-pay

## 2020-03-06 DIAGNOSIS — R7982 Elevated C-reactive protein (CRP): Secondary | ICD-10-CM

## 2020-03-06 DIAGNOSIS — G453 Amaurosis fugax: Secondary | ICD-10-CM | POA: Insufficient documentation

## 2020-03-06 NOTE — Telephone Encounter (Signed)
Spoke with patient regarding appointment for Temporal Artery Doppler ordered by Dr. Joylene John Thursday 03/08/20 at 10:00 am at [facility]---arrival time is 9:45 am 1st floor admissions office.  Patient voiced her understanding.

## 2020-03-06 NOTE — Telephone Encounter (Signed)
Spoke to patient, aware of results and recommendations.  Order placed and will have scheduler call to arrange  appt with Neuro on 11/8.

## 2020-03-06 NOTE — Telephone Encounter (Signed)
New message:     Patient calling stating some one called her concerning results.

## 2020-03-08 ENCOUNTER — Ambulatory Visit (HOSPITAL_COMMUNITY)
Admission: RE | Admit: 2020-03-08 | Discharge: 2020-03-08 | Disposition: A | Discharge status: Home / Self Care | Payer: BC Managed Care – PPO | Source: Ambulatory Visit | Attending: Cardiology | Admitting: Cardiology

## 2020-03-08 ENCOUNTER — Other Ambulatory Visit: Payer: Self-pay

## 2020-03-08 DIAGNOSIS — R7982 Elevated C-reactive protein (CRP): Secondary | ICD-10-CM | POA: Diagnosis not present

## 2020-03-08 DIAGNOSIS — G453 Amaurosis fugax: Secondary | ICD-10-CM | POA: Insufficient documentation

## 2020-03-08 NOTE — Progress Notes (Signed)
Temporal artery duplex has been completed.   Preliminary results in CV Proc.   Abram Sander 03/08/2020 10:41 AM

## 2020-03-09 NOTE — Progress Notes (Signed)
NEUROLOGY CONSULTATION NOTE  Kimberly Macdonald MRN: [id number] DOB: 03-21-1966  Referring provider: Oswaldo Milian, MD Primary care provider: Grant Fontana, MD  Reason for consult:  Amaurosis fugax  HISTORY OF PRESENT ILLNESS: Kimberly Macdonald is a 54 year old right-handed female with hypothyroidism who presents for evaluation of right-sided amaurosis fugax.  History supplemented by PCP and cardiology notes.  In early October, she was looking at her laptop and had vision loss in right eye, like window shade coming down and couldn't see for 7 minutes (complete black out of vision).  No ocular pain or headache.  She saw an ophthalmologist and had an unremarkable exam.  Does have a cataract in the right eye.  She has history of palpitations for which are controlled with metoprolol.  She has no history of hypertension, hyperlipidemia, diabetes, tobacco use or thrombosis.  She exercises regularly with weights, using the elliptical and boxing.  She followed up with cardiology.  Carotid ultrasound from 03/06/2020 showed no hemodynamically significant stenosis of the bilateral ICAs.  Sed rate was 19 but CRP was elevated at 44. TSH was 1.390 and lipid panel with total chol 180, TG 163, HDL 72 and LDL 81.  Due to elevated CRP, temporal artery [place] was performed on 03/08/2020, which demonstrated patent temporal arteries but mild wall thickening on the left.  Long term cardiac event monitor and echocardiogram have been ordered and still pending.  Since then, she notes occasional mild pressure around her right eye.  She also feels a little more tired.    No recurrence or prior history of similar episodes.  No history of blood clots or headaches.  She reports occasional episodes of visual aura (zig zag lines and such) while driving at night.  She has history of 4 miscarriages.    PAST MEDICAL HISTORY: Past Medical History:  Diagnosis Date  . Acne   . Anal intraepithelial neoplasia I   . Cataract     right eye, getting a second opinion  . Cervical dysplasia   . Depression   . Diverticulosis   . GERD (gastroesophageal reflux disease)    with pregnancy  . Heart palpitations    History of  . History of colon polyps   . History of iron deficiency anemia    as teenager  . History of rectal bleeding   . History of shingles   . History of vertigo   . Hypothyroidism   . Internal hemorrhoids   . Interstitial cystitis   . Low blood pressure   . PONV (postoperative nausea and vomiting)   . Poor circulation of extremity    bilateral lower  . Sinus tachycardia    History of  . Wears glasses     PAST SURGICAL HISTORY: Past Surgical History:  Procedure Laterality Date  . APPENDECTOMY  1994  . BREAST ENHANCEMENT SURGERY    . Cardiometabolic Testing  62/6948   normal effort with peak VO2 of 73% predicted, HR accelerated at 97% predcited, HR on VO2 curve showed steep invline at anaerobic threshold - low risk, abnormal  . CERVICAL CONIZATION W/BX    . CHOLECYSTECTOMY    . CYSTOSCOPY W/ DILATION OF BLADDER    . DILATION AND EVACUATION     miscarriage x3  . GYNECOLOGIC CRYOSURGERY    . HIGH RESOLUTION ANOSCOPY N/A 05/12/2019   Procedure: HIGH RESOLUTION ANOSCOPY, ANAL BIOPSY;  Surgeon: Leighton Ruff, MD;  Location: [facility];  Service: General;  Laterality: N/A;  . SHOULDER  SURGERY Left    Muscle detachment  . TONSILLECTOMY    . TRANSTHORACIC ECHOCARDIOGRAM  03/2005   EF 55-65%, normal LV systolic function - no LV regional wall motion abnormalities  . VAGINAL WOUND CLOSURE / REPAIR     after vaginal delivery  . WISDOM TOOTH EXTRACTION      MEDICATIONS: Current Outpatient Medications on File Prior to Visit  Medication Sig Dispense Refill  . b complex vitamins tablet Take 1 tablet by mouth daily.    Marland Kitchen co-enzyme Q-10 30 MG capsule Take 300 mg by mouth daily.     Marland Kitchen L-Arginine 1000 MG TABS Take by mouth.     . Lactobacillus (PROBIOTIC ACIDOPHILUS PO) Take by mouth.     . levothyroxine (SYNTHROID, LEVOTHROID) 88 MCG tablet Take 88 mcg by mouth every evening.     . metoprolol succinate (TOPROL-XL) 50 MG 24 hr tablet TAKE 1 TABLET BY MOUTH WITH OR IMMEDIATELY FOLLOWING A MEAL. (Patient taking differently: Take 50 mg by mouth. TAKE 1 TABLET BY MOUTH WITH OR IMMEDIATELY FOLLOWING A MEAL. Pt takes at night time.) 90 tablet 0  . Multiple Vitamin (MULTIVITAMIN) tablet Take 1 tablet by mouth daily.    . phentermine 37.5 MG capsule Take 37.5 mg by mouth every morning. Taking 1/2 daily (Patient not taking: Reported on 02/29/2020)    . traZODone (DESYREL) 50 MG tablet Take 50 mg by mouth at bedtime. (Patient not taking: Reported on 02/29/2020)    . valACYclovir (VALTREX) 500 MG tablet      No current facility-administered medications on file prior to visit.    ALLERGIES: Allergies  Allergen Reactions  . Azo [Phenazopyridine Hcl] Hives  . Flagyl [Metronidazole] Hives  . Nitrofurantoin Monohyd Macro Rash    Flu like sxs  . Red Dye Hives  . Lodine [Etodolac]     "Advil is ok"  . Macrolides And Ketolides     FAMILY HISTORY: Family History  Problem Relation Age of Onset  . Hypertension Mother   . Diverticulitis Mother        part of colon removed  . Colon polyps Mother   . Hyperlipidemia Father   . Hypertension Father   . Depression Sister   . Breast cancer Maternal Grandmother   . Heart disease Paternal Grandmother        MI   . Heart disease Paternal Grandfather        MI  . Colon cancer Neg Hx   . Esophageal cancer Neg Hx   . Rectal cancer Neg Hx     SOCIAL HISTORY: Social History   Socioeconomic History  . Marital status: Married    Spouse name: Not on file  . Number of children: 3  . Years of education: 11yr coll.  . Highest education level: Not on file  Occupational History  . Occupation: singer/actor  Tobacco Use  . Smoking status: Never Smoker  . Smokeless tobacco: Never Used  Vaping Use  . Vaping Use: Never used  Substance and  Sexual Activity  . Alcohol use: Yes    Comment: occasionally (twice yearly)  . Drug use: No  . Sexual activity: Not Currently    Partners: Male    Birth control/protection: Post-menopausal  Other Topics Concern  . Not on file  Social History Narrative  . Not on file   Social Determinants of Health   Financial Resource Strain:   . Difficulty of Paying Living Expenses: Not on file  Food Insecurity:   . Worried About  Running Out of Food in the Last Year: Not on file  . Ran Out of Food in the Last Year: Not on file  Transportation Needs:   . Lack of Transportation (Medical): Not on file  . Lack of Transportation (Non-Medical): Not on file  Physical Activity:   . Days of Exercise per Week: Not on file  . Minutes of Exercise per Session: Not on file  Stress:   . Feeling of Stress : Not on file  Social Connections:   . Frequency of Communication with Friends and Family: Not on file  . Frequency of Social Gatherings with Friends and Family: Not on file  . Attends Religious Services: Not on file  . Active Member of Clubs or Organizations: Not on file  . Attends Archivist Meetings: Not on file  . Marital Status: Not on file  Intimate Partner Violence:   . Fear of Current or Ex-Partner: Not on file  . Emotionally Abused: Not on file  . Physically Abused: Not on file  . Sexually Abused: Not on file   PHYSICAL EXAM: Blood pressure 122/75, pulse 87, height 5\' 2"  (1.575 m), weight 129 lb 3.2 oz (58.6 kg), last menstrual period 06/21/2015, SpO2 99 %. General: No acute distress.  Patient appears well-groomed.   Head:  Normocephalic/atraumatic Eyes:  fundi examined but not visualized Neck: supple, no paraspinal tenderness, full range of motion Back: No paraspinal tenderness Heart: regular rate and rhythm Lungs: Clear to auscultation bilaterally. Vascular: No carotid bruits. Neurological Exam: Mental status: alert and oriented to person, place, and time, recent and remote  memory intact, fund of knowledge intact, attention and concentration intact, speech fluent and not dysarthric, language intact. Cranial nerves: CN I: not tested CN II: pupils equal, round and reactive to light, visual fields intact CN III, IV, VI:  full range of motion, no nystagmus, no ptosis CN V: facial sensation intact CN VII: upper and lower face symmetric CN VIII: hearing intact CN IX, X: gag intact, uvula midline CN XI: sternocleidomastoid and trapezius muscles intact CN XII: tongue midline Bulk & Tone: normal, no fasciculations. Motor:  5/5 throughout  Sensation:  Pinprick and vibration sensation intact.  Deep Tendon Reflexes:  2+ throughout, toes downgoing.  Finger to nose testing:  Without dysmetria.  Heel to shin:  Without dysmetria.   Gait:  Normal station and stride.  Able to turn.  Romberg negative.  IMPRESSION: 1.  Right-sided amaurosis fugax.  Cryptogenic.  I do not suspect temporal arteritis.  Unclear if it may be cardioembolic (workup currently ongoing).  No associated carotid artery stenosis.  Low suspicion that this was a migraine.  PLAN: 1.  Recommend starting ASA 81mg  daily 2.  Will check MRI and MRA of brain 3.  Will check hypercoagulable/thrombophilia panel. 4.  Follow up on echocardiogram and event monitor 5.  Follow up after testing.  Thank you for allowing me to take part in the care of this patient.  Metta Clines, DO  CC:  Oswaldo Milian, MD  Grant Fontana, MD

## 2020-03-12 ENCOUNTER — Other Ambulatory Visit: Payer: Self-pay

## 2020-03-12 ENCOUNTER — Ambulatory Visit (INDEPENDENT_AMBULATORY_CARE_PROVIDER_SITE_OTHER): Payer: BC Managed Care – PPO | Admitting: Neurology

## 2020-03-12 ENCOUNTER — Encounter: Payer: Self-pay | Admitting: Neurology

## 2020-03-12 ENCOUNTER — Other Ambulatory Visit (INDEPENDENT_AMBULATORY_CARE_PROVIDER_SITE_OTHER): Payer: BC Managed Care – PPO

## 2020-03-12 VITALS — BP 122/75 | HR 87 | Ht 62.0 in | Wt 129.2 lb

## 2020-03-12 DIAGNOSIS — G453 Amaurosis fugax: Secondary | ICD-10-CM | POA: Diagnosis not present

## 2020-03-12 NOTE — Patient Instructions (Signed)
1.  We will check MRI and MRA of brain 2.  We will check hypercoagulable work up including factor V leiden gene mutation 3.  Start aspirin 81mg  daily 4.  Follow up after testing.   Amaurosis Fugax Amaurosis fugax, also called transient visual loss, is a condition in which a person loses sight in one eye or, rarely, both eyes for a short time. The vision loss in the affected eye may be total or partial. The vision loss usually lasts for only a few seconds or minutes before sight returns to normal. In some cases, vision loss may last for several hours. This condition is typically caused by interruption of blood flow in the artery that supplies blood to the retina. The retina is the part of the eye that contains the nerves needed for sight. This condition can be a warning sign that a stroke may happen, either in the eye or in the brain. A stroke can result in permanent vision loss or loss of other body functions. What are the causes? This condition is caused by a loss or interruption of blood flow to the retinal artery. Causes for the change in blood flow include:  A buildup of cholesterol and fats, or plaque, in the artery (atherosclerosis). If any plaque breaks off and gets into the bloodstream, it can travel to other blood vessels, such as the retinal artery.  Diseases of the heart valves.  Certain blood conditions, such as sickle cell anemia, leukemia, and blood clotting (coagulation) disorders.  Inflammation of the arteries (vasculitis).  A fast or irregular heartbeat, such as atrial fibrillation.  Family history of stroke. What increases the risk? The following factors may make you more likely to develop this condition:  Use of any tobacco products, including cigarettes, chewing tobacco, or electronic cigarettes.  Poorly controlled diabetes.  Conditions that can lead to diseases of the heart and blood vessels (cardiovascular diseases), such as: ? High blood pressure  (hypertension). ? High cholesterol.  Drinking too much alcohol regularly.  Use of drugs, especially cocaine.  Age. The risk increases with age. What are the signs or symptoms? The main symptom of this condition is painless, sudden loss of vision in one eye. The vision loss often starts at the top and moves down, as if a curtain is being pulled down over your eye. This is usually followed by a quick return of vision. However, symptoms may last for several hours. It is important to seek medical care right away even if your symptoms go away. How is this diagnosed? This condition is diagnosed by:  Medical history and physical exam.  Eye exam, including dilating drops and looking at the back of your eyes.  Carotid ultrasound. This checks for plaque in the carotid arteries in your neck.  Magnetic resonance angiography (MRA). This checks the carotid artery and the branches that supply the brain. MRA looks for areas of blockage or disease. You may also have other tests, including:  Blood tests.  Electrocardiogram (ECG) to check your heart rhythm.  Echocardiogram (ECHO) to check your heart function. How is this treated? Emergency treatment for this condition may involve massaging the eyeball or using certain breathing techniques to remove or ease the blockage of the retinal artery. You may also get an emergency referral to a clinic or medical center that treats strokes. Other treatments focus on reducing your risk of having a stroke in the future. These may include:  Medicines, such as medicines to manage high blood pressure, diabetes, or  cholesterol, or to thin the blood.  Surgical procedures, such as: ? Carotid endarterectomy to remove plaque from the carotid artery. ? Carotid angioplasty and placement of a small, mesh tube (stenting) to open the blocked part of the artery.  Lifestyle changes, such as stopping tobacco use, changing your diet, and getting enough exercise. Follow these  instructions at home: Medicines  Take over-the-counter and prescription medicines only as told by your health care provider.  If you are taking blood thinners: ? Talk with your health care provider before you take any medicines that contain aspirin or NSAIDs, such as ibuprofen. These medicines increase your risk for dangerous bleeding. ? Take your medicine exactly as told, at the same time every day. ? Avoid activities that could cause injury or bruising, and follow instructions about how to prevent falls. ? Wear a medical alert bracelet or carry a card that lists what medicines you take. Eating and drinking   Eat a diet that includes five or more servings of fruits and vegetables each day. This may reduce the risk of stroke.  Certain diets may help with high blood pressure, high cholesterol, diabetes, or obesity. These include: ? A diet that is low in salt (sodium) to manage high blood pressure. ? A high-fiber diet that is low in saturated fat, trans fat, and cholesterol to control cholesterol levels. ? A low-carbohydrate, low-sugar diet to manage diabetes. ? A reduced-calorie diet that is low in sodium, saturated fat, trans fat, and cholesterol to manage obesity. Lifestyle   Maintain a healthy weight.  Stay physically active. Try to get at least 30 minutes of activity on most or all days.  Do not use any products that contain nicotine or tobacco, such as cigarettes, e-cigarettes, and chewing tobacco. If you need help quitting, ask your health care provider.  Do not misuse drugs. General instructions  Do not drink alcohol if: ? Your health care provider tells you not to drink. ? You are pregnant, may be pregnant, or are planning to become pregnant.  If you drink alcohol: ? Limit how much you use to:  0-1 drink a day for women.  0-2 drinks a day for men. ? Be aware of how much alcohol is in your drink. In the [place] one drink equals one 12 oz bottle of beer (355 mL), one 5 oz  glass of wine (148 mL), or one 1 oz glass of hard liquor (44 mL).  Keep all follow-up visits as told by your health care provider. This is important. Contact a health care provider if:  You lose vision in one eye or both eyes. Get help right away if:   You have chest pain or an irregular heartbeat.  You have any symptoms of a stroke. "BE FAST" is an easy way to remember the main warning signs of a stroke. ? B - Balance. Signs are dizziness, sudden trouble walking, or loss of balance. ? E - Eyes. Signs are trouble seeing or a sudden change in vision. ? F - Face. Signs are sudden weakness or numbness of the face, or the face or eyelid drooping on one side. ? A - Arms. Signs are weakness or numbness in an arm. This happens suddenly and usually on one side of the body. ? S - Speech. Signs are sudden trouble speaking, slurred speech, or trouble understanding what people say. ? T - Time. Time to call emergency services. Write down what time symptoms started.  You have other signs of a stroke, such  as: ? A sudden, severe headache with no known cause. ? Nausea or vomiting. ? Seizure. These symptoms may represent a serious problem that is an emergency. Do not wait to see if the symptoms will go away. Get medical help right away. Call your local emergency services (911 in the [place] Do not drive yourself to the hospital. Summary  Amaurosis fugax is a condition in which you lose sight for a short time.  This condition can be a warning sign that a stroke may happen. A stroke can result in permanent vision loss or loss of other body functions.  Seek medical care right away even if your symptoms go away. This information is not intended to replace advice given to you by your health care provider. Make sure you discuss any questions you have with your health care provider. Document Revised: 11/03/2018 Document Reviewed: 11/03/2018 Elsevier Patient Education  [place].

## 2020-03-21 ENCOUNTER — Other Ambulatory Visit: Payer: Self-pay

## 2020-03-21 ENCOUNTER — Encounter: Payer: Self-pay | Admitting: Registered Nurse

## 2020-03-21 ENCOUNTER — Ambulatory Visit: Payer: BC Managed Care – PPO | Admitting: Registered Nurse

## 2020-03-21 VITALS — BP 130/79 | HR 73 | Temp 98.0°F | Resp 18 | Ht 62.0 in | Wt 130.2 lb

## 2020-03-21 DIAGNOSIS — R7982 Elevated C-reactive protein (CRP): Secondary | ICD-10-CM | POA: Diagnosis not present

## 2020-03-21 DIAGNOSIS — R221 Localized swelling, mass and lump, neck: Secondary | ICD-10-CM

## 2020-03-21 NOTE — Patient Instructions (Signed)
° ° ° °  If you have lab work done today you will be contacted with your lab results within the next 2 weeks.  If you have not heard from us then please contact us. The fastest way to get your results is to register for My Chart. ° ° °IF you received an x-ray today, you will receive an invoice from [facility]. Please contact [facility] at [phone] with questions or concerns regarding your invoice.  ° °IF you received labwork today, you will receive an invoice from [facility]. Please contact [facility] at [phone] with questions or concerns regarding your invoice.  ° °Our billing staff will not be able to assist you with questions regarding bills from these companies. ° °You will be contacted with the lab results as soon as they are available. The fastest way to get your results is to activate your My Chart account. Instructions are located on the last page of this paperwork. If you have not heard from us regarding the results in 2 weeks, please contact this office. °  ° ° ° °

## 2020-03-22 ENCOUNTER — Other Ambulatory Visit: Payer: Self-pay | Admitting: Emergency Medicine

## 2020-03-22 ENCOUNTER — Telehealth: Payer: Self-pay | Admitting: Registered Nurse

## 2020-03-22 DIAGNOSIS — R000 Tachycardia, unspecified: Secondary | ICD-10-CM

## 2020-03-22 LAB — THYROID PANEL WITH TSH
Free Thyroxine Index: 2.7 (ref 1.2–4.9)
T3 Uptake Ratio: 29 % (ref 24–39)
T4, Total: 9.2 ug/dL (ref 4.5–12.0)
TSH: 1.28 u[IU]/mL (ref 0.450–4.500)

## 2020-03-22 LAB — ANA W/REFLEX: Anti Nuclear Antibody (ANA): NEGATIVE

## 2020-03-22 LAB — C-REACTIVE PROTEIN: CRP: 3 mg/L (ref 0–10)

## 2020-03-22 MED ORDER — METOPROLOL SUCCINATE ER 50 MG PO TB24: 50.0000 mg | ORAL_TABLET | Freq: Every day | ORAL | 0 refills | Status: DC

## 2020-03-22 NOTE — Telephone Encounter (Signed)
   Notes to clinic:  Patient states that meds were suppose to be sent yesterday Patient would like a call once they are sent    Requested Prescriptions  Pending Prescriptions Disp Refills   valACYclovir (VALTREX) 500 MG tablet        Antimicrobials:  Antiviral Agents - Anti-Herpetic Passed - 03/22/2020 10:33 AM      Passed - Valid encounter within last 12 months    Recent Outpatient Visits           Yesterday Elevated C-reactive protein (CRP)   [facility], Delfino Lovett, NP   1 year ago Intermittent diarrhea   [facility], Lilia Argue, MD   1 year ago Tick bite of left scapular region, initial encounter   [facility] at [facility], Rex Kras, MD   1 year ago Special screening for malignant neoplasms, colon   Primary Care at [facility], Arlie Solomons, MD   2 years ago Tachycardia   Primary Care at [place], [place] D, PA-C       Future Appointments             In 1 month Maximiano Coss, NP Primary Care at [place], [facility]   In 2 months Donato Heinz, MD [facility], [facility]              metoprolol succinate (TOPROL-XL) 50 MG 24 hr tablet 90 tablet 0    Sig: Take with or immediately following a meal.      Cardiovascular:  Beta Blockers Passed - 03/22/2020 10:33 AM      Passed - Last BP in normal range    BP Readings from Last 1 Encounters:  03/21/20 130/79          Passed - Last Heart Rate in normal range    Pulse Readings from Last 1 Encounters:  03/21/20 73          Passed - Valid encounter within last 6 months    Recent Outpatient Visits           Yesterday Elevated C-reactive protein (CRP)   [facility], Delfino Lovett, NP   1 year ago Intermittent diarrhea   [facility], Lilia Argue, MD   1 year ago Tick bite of left scapular region, initial encounter   Primary Care at [facility], Rex Kras, MD   1 year ago Special screening for malignant neoplasms, colon   [facility] at  [facility], Arlie Solomons, MD   2 years ago Tachycardia   [facility] at [facility] Reather Laurence, PA-C       Future Appointments             In 1 month Maximiano Coss, NP Primary Care at [place], [facility]   In 2 months Donato Heinz, MD [facility] [place], [facility]

## 2020-03-22 NOTE — Telephone Encounter (Signed)
This patient was lat seen by Dr. Pamella Pert 01/20/2019 she has an upcoming appt with you on 05/09/2020. She need a refill on a medication that given by  a historical provider with no direction. Can you refill these meds before you see patient in Jan or will this patient need an acute visit?

## 2020-03-22 NOTE — Telephone Encounter (Signed)
Pt called and stated she needs these medications. She states medication is for her heart. Sent in today and if not then there will be per patient "reprocutions" Please advise.

## 2020-03-22 NOTE — Telephone Encounter (Signed)
Medication Refill - Medication: metoprolol succinate (TOPROL-XL) 50 MG 24 hr tablet ,valACYclovir (VALTREX) 500 MG tablet ,doxycycline (VIBRA-TABS) 100 MG tablet (Patient was seen yesterday and was advised by PCP that medication would be sent to pharmacy immediately. Patient has contacted pharmacy and her medication is not there. Patient stated that she needs her heart medication right away and would like a callback from nurse once medication have been sent to pharmacy today.)  Has the patient contacted their pharmacy? yes (Agent: If no, request that the patient contact the pharmacy for the refill.) (Agent: If yes, when and what did the pharmacy advise?)Contact PCP  Preferred Pharmacy (with phone number or street name):  [id number] - [place], [place] [address] Phone:  [phone]       Agent: Please be advised that RX refills may take up to 3 business days. We ask that you follow-up with your pharmacy.

## 2020-03-23 MED ORDER — METOPROLOL SUCCINATE ER 50 MG PO TB24: ORAL_TABLET | ORAL | 0 refills | Status: DC

## 2020-03-23 MED ORDER — VALACYCLOVIR HCL 500 MG PO TABS: ORAL_TABLET | ORAL | 3 refills | Status: DC

## 2020-03-23 NOTE — Telephone Encounter (Signed)
Pt needs Rx for doxycycline (VIBRA-TABS) 100 MG tablet  For boil on her face/ please send asap today /please advise

## 2020-03-23 NOTE — Progress Notes (Signed)
Good morning -   If we could call patient -  Labs all came back normal. No distinct direction that we should pursue further here, keep working with specialists to investigate.  Thank you  Kathrin Ruddy, NP

## 2020-03-26 ENCOUNTER — Telehealth: Payer: Self-pay | Admitting: Registered Nurse

## 2020-03-26 ENCOUNTER — Other Ambulatory Visit: Payer: Self-pay

## 2020-03-26 ENCOUNTER — Ambulatory Visit (HOSPITAL_COMMUNITY): Payer: BC Managed Care – PPO | Attending: Cardiology

## 2020-03-26 DIAGNOSIS — G453 Amaurosis fugax: Secondary | ICD-10-CM

## 2020-03-26 DIAGNOSIS — R002 Palpitations: Secondary | ICD-10-CM

## 2020-03-26 LAB — ECHOCARDIOGRAM COMPLETE
Area-P 1/2: 2.63 cm2
P 1/2 time: 794 msec
S' Lateral: 3 cm

## 2020-03-26 NOTE — Telephone Encounter (Signed)
What is this referring to??

## 2020-03-26 NOTE — Telephone Encounter (Signed)
Pt called back again she is wanting NP Orland Mustard to give her a call she stated she will be calling back every 30 mins.   Pt stated that she was seen on 11/17 for all of her medications. Including for the boil on her face.  Please advise.

## 2020-03-26 NOTE — Telephone Encounter (Signed)
Appt for any antibiotics and this is new issue.

## 2020-03-26 NOTE — Telephone Encounter (Signed)
Pt asking for Abx for boil on her face, I see no mention of said boil per last OV 03/21/2020 tried making appt for this pt insisted it was discussed and wants you to send this in. Does pt need an appointment?

## 2020-03-26 NOTE — Telephone Encounter (Signed)
Pt called back and stated she was provider about this last time she was in. Pt would like these sent in today for it. Please advise.

## 2020-03-27 ENCOUNTER — Other Ambulatory Visit: Payer: Self-pay | Admitting: Registered Nurse

## 2020-03-27 DIAGNOSIS — L700 Acne vulgaris: Secondary | ICD-10-CM

## 2020-03-27 MED ORDER — DOXYCYCLINE HYCLATE 100 MG PO TABS: 100.0000 mg | ORAL_TABLET | Freq: Two times a day (BID) | ORAL | 0 refills | Status: DC

## 2020-03-27 NOTE — Telephone Encounter (Signed)
Sent ? ?Thanks, ? ?Rich

## 2020-03-27 NOTE — Progress Notes (Signed)
Per patient, has taken doxycyline many times before without issue.   Kathrin Ruddy, NP

## 2020-03-28 ENCOUNTER — Other Ambulatory Visit: Payer: Self-pay | Admitting: *Deleted

## 2020-03-28 DIAGNOSIS — I351 Nonrheumatic aortic (valve) insufficiency: Secondary | ICD-10-CM

## 2020-03-29 DIAGNOSIS — R002 Palpitations: Secondary | ICD-10-CM | POA: Diagnosis not present

## 2020-04-01 ENCOUNTER — Ambulatory Visit
Admission: RE | Admit: 2020-04-01 | Discharge: 2020-04-01 | Disposition: A | Discharge status: Home / Self Care | Payer: BC Managed Care – PPO | Source: Ambulatory Visit | Attending: Neurology | Admitting: Neurology

## 2020-04-01 ENCOUNTER — Other Ambulatory Visit: Payer: Self-pay

## 2020-04-01 DIAGNOSIS — G453 Amaurosis fugax: Secondary | ICD-10-CM

## 2020-04-01 MED ORDER — GADOBENATE DIMEGLUMINE 529 MG/ML IV SOLN
11.0000 mL | Freq: Once | INTRAVENOUS | Status: AC | PRN
  Administered 2020-04-01: 11 mL via INTRAVENOUS

## 2020-04-02 ENCOUNTER — Other Ambulatory Visit: Payer: Self-pay

## 2020-04-02 NOTE — Progress Notes (Signed)
Tried calling pt, No answer. LMOVM 10:01 am

## 2020-04-02 NOTE — Progress Notes (Signed)
Pt advised of her MRI results.

## 2020-04-03 ENCOUNTER — Other Ambulatory Visit: Payer: Self-pay

## 2020-04-03 ENCOUNTER — Ambulatory Visit (HOSPITAL_COMMUNITY)
Admission: RE | Admit: 2020-04-03 | Discharge: 2020-04-03 | Disposition: A | Discharge status: Home / Self Care | Payer: BC Managed Care – PPO | Source: Ambulatory Visit | Attending: Registered Nurse | Admitting: Registered Nurse

## 2020-04-03 DIAGNOSIS — R221 Localized swelling, mass and lump, neck: Secondary | ICD-10-CM | POA: Insufficient documentation

## 2020-04-03 DIAGNOSIS — R590 Localized enlarged lymph nodes: Secondary | ICD-10-CM | POA: Diagnosis not present

## 2020-04-03 DIAGNOSIS — E079 Disorder of thyroid, unspecified: Secondary | ICD-10-CM | POA: Diagnosis not present

## 2020-04-03 DIAGNOSIS — E034 Atrophy of thyroid (acquired): Secondary | ICD-10-CM | POA: Diagnosis not present

## 2020-04-09 ENCOUNTER — Other Ambulatory Visit: Payer: Self-pay

## 2020-04-09 ENCOUNTER — Telehealth: Payer: Self-pay | Admitting: Registered Nurse

## 2020-04-09 ENCOUNTER — Telehealth: Payer: Self-pay | Admitting: Neurology

## 2020-04-09 LAB — HYPERCOAGULABLE PANEL, COMPREHENSIVE
APTT: 26.6 s
AT III Act/Nor PPP Chro: 125 %
Act. Prt C Resist w/FV Defic.: 2.6 ratio
Anticardiolipin Ab, IgG: <10 [GPL'U]
Anticardiolipin Ab, IgM: 11 [MPL'U]
Beta-2 Glycoprotein I, IgA: <10 SAU
Beta-2 Glycoprotein I, IgG: <10 SGU
Beta-2 Glycoprotein I, IgM: <10 SMU
DRVVT Screen Seconds: 35.7 s
Factor VII Antigen**: 190 % — ABNORMAL HIGH
Factor VIII Activity: 93 %
Hexagonal Phospholipid Neutral: 0 s
Homocysteine: 6.9 umol/L
Prot C Ag Act/Nor PPP Imm: 150 %
Prot S Ag Act/Nor PPP Imm: 142 %
Protein C Ag/FVII Ag Ratio**: 0.8 ratio
Protein S Ag/FVII Ag Ratio**: 0.7 ratio

## 2020-04-09 LAB — FACTOR V LEIDEN

## 2020-04-09 NOTE — Telephone Encounter (Signed)
Pt would like a cb concerning an ultrasound of her thyroid  Orland Mustard ordered for pt.  Please advise at [phone].

## 2020-04-09 NOTE — Telephone Encounter (Signed)
Patient called and said, "I need to get the rest of my MR study results."

## 2020-04-09 NOTE — Telephone Encounter (Signed)
Hypercoagulable panel is negative

## 2020-04-09 NOTE — Telephone Encounter (Signed)
I have called pt and informed her the result but I will get back to her with the interpretation from the provider and she stated understanding.

## 2020-04-09 NOTE — Telephone Encounter (Signed)
Telephone call to pt, re advised pt of our phone call last week. [facility] is short staffed had to send labs out to get resulted.   Per pt chart labs results received at 1:30pm. Dr.Jaffe in clinic once he gets a chance he will review them and let us know.  As for the tiny spot on MRI: Re advised pt of DR.Jaffe note.  Pt states there should be a better why to let pt know what the tiny spots are and will this cause any problems.

## 2020-04-10 NOTE — Telephone Encounter (Signed)
No further workup.  Usually workup is negative.  I would just continue aspirin 81mg  daily.  She should make a follow up appointment to see how she is doing (usually 4 to 6 months from initial visit)

## 2020-04-10 NOTE — Telephone Encounter (Signed)
Left a detailed message for pt

## 2020-04-12 NOTE — Telephone Encounter (Signed)
Tried calling pt, no answer. LMOVM to call back.

## 2020-04-12 NOTE — Telephone Encounter (Signed)
Patient called and left a message requesting a call back from [facility] regarding questions about her results.

## 2020-04-23 NOTE — Telephone Encounter (Signed)
Please close thank you

## 2020-04-23 NOTE — Telephone Encounter (Signed)
Has this been completed?  Sending to clinical staff for review: Okay to sign/close encounter or is further follow up needed? 

## 2020-05-09 ENCOUNTER — Other Ambulatory Visit: Payer: Self-pay

## 2020-05-09 ENCOUNTER — Ambulatory Visit (INDEPENDENT_AMBULATORY_CARE_PROVIDER_SITE_OTHER): Payer: BC Managed Care – PPO | Admitting: Registered Nurse

## 2020-05-09 ENCOUNTER — Encounter: Payer: Self-pay | Admitting: Registered Nurse

## 2020-05-09 VITALS — BP 119/60 | HR 80 | Temp 97.9°F | Ht 62.0 in | Wt 135.4 lb

## 2020-05-09 DIAGNOSIS — Z7689 Persons encountering health services in other specified circumstances: Secondary | ICD-10-CM | POA: Diagnosis not present

## 2020-05-09 DIAGNOSIS — L700 Acne vulgaris: Secondary | ICD-10-CM | POA: Diagnosis not present

## 2020-05-09 MED ORDER — DOXYCYCLINE HYCLATE 100 MG PO TABS: 100.0000 mg | ORAL_TABLET | Freq: Two times a day (BID) | ORAL | 0 refills | Status: DC

## 2020-05-09 NOTE — Progress Notes (Signed)
Established Patient Office Visit  Subjective:  Patient ID: Kimberly Macdonald, female    DOB: February 02, 1966  Age: 55 y.o. MRN: [id number]  CC:  Chief Complaint  Patient presents with  . 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 MRI and MRA of brain negative. Top suspect from ophth and neuro is transient clot. Pt has no ongoing symptoms. Will continue to follow with ophth.  Notes she is having ongoing breakout of acne. Had taken doxycycline in the past with good effect. Hopes to do one more round. Has benzaclin at home, uses when needed. Has other OTC skin care products as well, states she takes very close care of skin.  We did spend some time discussing implications of appendectomy, tonsillectomy, and cholecystectomy - while the full implications are not entirely understood and are an area of much research, she is not facing any concerns at this time.  Otherwise no concerns. Feeling well in general.   Past Medical History:  Diagnosis Date  . Acne   . Anal intraepithelial neoplasia I   . Cataract    right eye, getting a second opinion  . Cervical dysplasia   . Depression   . Diverticulosis   . GERD (gastroesophageal reflux disease)    with pregnancy  . Heart palpitations    History of  . History of colon polyps   . History of iron deficiency anemia    as teenager  . History of rectal bleeding   . History of shingles   . History of vertigo   . Hypothyroidism   . Internal hemorrhoids   . Interstitial cystitis    resolved  . Low blood pressure   . PONV (postoperative nausea and vomiting)   . Poor circulation of extremity    bilateral lower  . Sinus tachycardia    History of  . Thyroid disease    Phreesia 05/06/2020  . Wears glasses     Past Surgical History:  Procedure Laterality  Date  . APPENDECTOMY  1994  . BREAST ENHANCEMENT SURGERY    . Cardiometabolic Testing  02/2012   normal effort with peak VO2 of 73% predicted, HR accelerated at 97% predcited, HR on VO2 curve showed steep invline at anaerobic threshold - low risk, abnormal  . CERVICAL CONIZATION W/BX    . CHOLECYSTECTOMY    . CYSTOSCOPY W/ DILATION OF BLADDER    . DILATION AND EVACUATION     miscarriage x3  . GYNECOLOGIC CRYOSURGERY    . HIGH RESOLUTION ANOSCOPY N/A 05/12/2019   Procedure: HIGH RESOLUTION ANOSCOPY, ANAL BIOPSY;  Surgeon: Romie Levee, MD;  Location: [facility] ;  Service: General;  Laterality: N/A;  . SHOULDER SURGERY Left    Muscle detachment  . TONSILLECTOMY    . TRANSTHORACIC ECHOCARDIOGRAM  03/2005   EF 55-65%, normal LV systolic function - no LV regional wall motion abnormalities  . VAGINAL WOUND CLOSURE / REPAIR     after vaginal delivery  . WISDOM TOOTH EXTRACTION      Family History  Problem Relation Age of Onset  . Hypertension Mother   . Diverticulitis Mother        part of colon removed  . Colon polyps Mother   . Hyperlipidemia Father   . Hypertension Father   . Depression Sister   .  Breast cancer Maternal Grandmother   . Heart disease Paternal Grandmother        MI   . Heart disease Paternal Grandfather        MI  . Colon cancer Neg Hx   . Esophageal cancer Neg Hx   . Rectal cancer Neg Hx     Social History   Socioeconomic History  . Marital status: Married    Spouse name: Not on file  . Number of children: 3  . Years of education: 75yr coll.  . Highest education level: Not on file  Occupational History  . Occupation: singer/actor  Tobacco Use  . Smoking status: Never Smoker  . Smokeless tobacco: Never Used  Vaping Use  . Vaping Use: Never used  Substance and Sexual Activity  . Alcohol use: Yes    Comment: occasionally (twice yearly)  . Drug use: No  . Sexual activity: Not Currently    Partners: Male    Birth control/protection:  Post-menopausal  Other Topics Concern  . Not on file  Social History Narrative   Right handed   Social Determinants of Health   Financial Resource Strain: Not on file  Food Insecurity: Not on file  Transportation Needs: Not on file  Physical Activity: Not on file  Stress: Not on file  Social Connections: Not on file  Intimate Partner Violence: Not on file    Outpatient Medications Prior to Visit  Medication Sig Dispense Refill  . b complex vitamins tablet Take 1 tablet by mouth daily.    Marland Kitchen co-enzyme Q-10 30 MG capsule Take 300 mg by mouth daily.     Marland Kitchen L-Arginine 1000 MG TABS Take by mouth.     . levothyroxine (SYNTHROID, LEVOTHROID) 88 MCG tablet Take 88 mcg by mouth every evening.     . metoprolol succinate (TOPROL-XL) 50 MG 24 hr tablet Take with or immediately following a meal. 90 tablet 0  . Multiple Vitamin (MULTIVITAMIN) tablet Take 1 tablet by mouth daily.    . phentermine 37.5 MG capsule Take 37.5 mg by mouth every morning. Taking 1/2 daily    . valACYclovir (VALTREX) 500 MG tablet Take 1 tablet (500mg ) by mouth twice daily PRN for outbreaks. 90 tablet 3  . traZODone (DESYREL) 50 MG tablet Take 50 mg by mouth at bedtime.    Marland Kitchen doxycycline (VIBRA-TABS) 100 MG tablet Take 1 tablet (100 mg total) by mouth 2 (two) times daily. (Patient not taking: Reported on 05/09/2020) 20 tablet 0  . Lactobacillus (PROBIOTIC ACIDOPHILUS PO) Take by mouth.    . metoprolol succinate (TOPROL-XL) 50 MG 24 hr tablet Take 1 tablet (50 mg total) by mouth daily. TAKE 1 TABLET BY MOUTH WITH OR IMMEDIATELY FOLLOWING A MEAL. Pt takes at night time. 90 tablet 0   No facility-administered medications prior to visit.    Allergies  Allergen Reactions  . Azo [Phenazopyridine Hcl] Hives  . Flagyl [Metronidazole] Hives  . Other Hives, Itching, Rash and Swelling  . Iodine Hives, Itching, Rash and Swelling  . Nitrofurantoin Monohyd Macro Rash    Flu like sxs  . Lodine [Etodolac]     "Advil is ok"  .  Macrolides And Ketolides     ROS Review of Systems  Constitutional: Negative.   HENT: Negative.   Eyes: Negative.   Respiratory: Negative.   Cardiovascular: Negative.   Gastrointestinal: Negative.   Genitourinary: Negative.   Musculoskeletal: Negative.   Skin: Negative.        Acne in area of  mask  Neurological: Negative.   Psychiatric/Behavioral: Negative.   All other systems reviewed and are negative.     Objective:    Physical Exam Vitals and nursing note reviewed.  Constitutional:      General: She is not in acute distress.    Appearance: Normal appearance. She is normal weight. She is not ill-appearing, toxic-appearing or diaphoretic.  Cardiovascular:     Rate and Rhythm: Normal rate and regular rhythm.     Heart sounds: Normal heart sounds. No murmur heard. No friction rub. No gallop.   Pulmonary:     Effort: Pulmonary effort is normal. No respiratory distress.     Breath sounds: Normal breath sounds. No stridor. No wheezing, rhonchi or rales.  Chest:     Chest wall: No tenderness.  Skin:    General: Skin is warm and dry.  Neurological:     General: No focal deficit present.     Mental Status: She is alert and oriented to person, place, and time. Mental status is at baseline.  Psychiatric:        Mood and Affect: Mood normal.        Behavior: Behavior normal.        Thought Content: Thought content normal.        Judgment: Judgment normal.     BP 119/60   Pulse 80   Temp 97.9 F (36.6 C) (Temporal)   Ht 5\' 2"  (1.575 m)   Wt 135 lb 6.4 oz (61.4 kg)   LMP 06/21/2015 (Approximate)   SpO2 98%   BMI 24.76 kg/m  Wt Readings from Last 3 Encounters:  05/09/20 135 lb 6.4 oz (61.4 kg)  03/21/20 130 lb 3.2 oz (59.1 kg)  03/12/20 129 lb 3.2 oz (58.6 kg)     Health Maintenance Due  Topic Date Due  . HIV Screening  Never done  . PAP SMEAR-Modifier  Never done  . COVID-19 Vaccine (3 - Booster for Pfizer series) 02/22/2020    There are no preventive  care reminders to display for this patient.  Lab Results  Component Value Date   TSH 1.280 03/21/2020   Lab Results  Component Value Date   WBC 7.9 02/29/2020   HGB 14.4 02/29/2020   HCT 42.7 02/29/2020   MCV 88 02/29/2020   PLT 324 02/29/2020   Lab Results  Component Value Date   NA 146 (H) 02/29/2020   K 4.4 02/29/2020   CO2 30 (H) 02/29/2020   GLUCOSE 85 02/29/2020   BUN 15 02/29/2020   CREATININE 0.74 02/29/2020   BILITOT 0.8 02/29/2020   ALKPHOS 100 02/29/2020   AST 19 02/29/2020   ALT 15 02/29/2020   PROT 7.0 02/29/2020   ALBUMIN 4.7 02/29/2020   CALCIUM 10.2 02/29/2020   Lab Results  Component Value Date   CHOL 180 02/29/2020   Lab Results  Component Value Date   HDL 72 02/29/2020   Lab Results  Component Value Date   LDLCALC 81 02/29/2020   Lab Results  Component Value Date   TRIG 163 (H) 02/29/2020   Lab Results  Component Value Date   CHOLHDL 2.5 02/29/2020   No results found for: HGBA1C    Assessment & Plan:   Problem List Items Addressed This Visit   None   Visit Diagnoses    Acne vulgaris       Relevant Medications   doxycycline (VIBRA-TABS) 100 MG tablet      Meds ordered this encounter  Medications  .  doxycycline (VIBRA-TABS) 100 MG tablet    Sig: Take 1 tablet (100 mg total) by mouth 2 (two) times daily.    Dispense:  20 tablet    Refill:  0    Follow-up: No follow-ups on file.   PLAN  Refill doxycycline  Refill metoprolol x 6 mo  Return in July for cpe and labs  Patient encouraged to call clinic with any questions, comments, or concerns.  Maximiano Coss, NP

## 2020-05-09 NOTE — Patient Instructions (Signed)
° ° ° °  If you have lab work done today you will be contacted with your lab results within the next 2 weeks.  If you have not heard from us then please contact us. The fastest way to get your results is to register for My Chart. ° ° °IF you received an x-ray today, you will receive an invoice from [facility]. Please contact [facility] at [phone] with questions or concerns regarding your invoice.  ° °IF you received labwork today, you will receive an invoice from [facility]. Please contact [facility] at [phone] with questions or concerns regarding your invoice.  ° °Our billing staff will not be able to assist you with questions regarding bills from these companies. ° °You will be contacted with the lab results as soon as they are available. The fastest way to get your results is to activate your My Chart account. Instructions are located on the last page of this paperwork. If you have not heard from us regarding the results in 2 weeks, please contact this office. °  ° ° ° °

## 2020-05-10 DIAGNOSIS — E663 Overweight: Secondary | ICD-10-CM | POA: Diagnosis not present

## 2020-05-10 DIAGNOSIS — Z6825 Body mass index (BMI) 25.0-25.9, adult: Secondary | ICD-10-CM | POA: Diagnosis not present

## 2020-05-27 NOTE — Progress Notes (Deleted)
Cardiology Office Note:    Date:  05/27/2020   ID:  Kimberly Macdonald, DOB [birth year], MRN [id number]  PCP:  Maximiano Coss, NP  Cardiologist:  No primary care provider on file.  Electrophysiologist:  None   Referring MD: Maximiano Coss, NP   No chief complaint on file.   History of Present Illness:    Kimberly Macdonald is a 55 y.o. female with a hx of hypothyroidism who presents for follow-up.  She was referred by Dr. Pamella Pert for evaluation of tachycardia, initially seen on 02/29/2020.  She previously followed with Dr. Debara Pickett, last seen in 2014.  She underwent MET test on 02/23/2012 which showed normal effort (peak VO2 73%), markedly accelerated heart rate at 97% predicted and a heart rate on VO2 curve showed a steep incline at anaerobic threshold with some flattening of the VO2 curve consistent with probable small vessel ischemia.  She was started on metoprolol.  Reports that she continues to take metoprolol and palpitations are under control while on the medication.  She denies any chest pain or dyspnea.  Reports she exercises by boxing regularly, lifting weights, and doing the elliptical.  Does report intermittent lightheadedness with standing.  Denies any syncope.  Reports 2 weeks ago had transient vision loss in her right eye.  Vision returned in 3 to 5 minutes.  Carotid duplex on 03/06/2020 showed no evidence of carotid stenosis.  Temporal artery ultrasound showed patent temporal arteries but mild wall thickening on the left.  Echocardiogram on 03/26/2020 showed normal biventricular function, mild to moderate aortic regurgitation.  Zio patch x14 days on 04/04/2020 showed short episode of junctional rhythm, lasting 6 beats.  No other significant abnormalities.  Since last clinic visit,  Past Medical History:  Diagnosis Date  . Acne   . Anal intraepithelial neoplasia I   . Cataract    right eye, getting a second opinion  . Cervical dysplasia   . Depression   . Diverticulosis    . GERD (gastroesophageal reflux disease)    with pregnancy  . Heart palpitations    History of  . History of colon polyps   . History of iron deficiency anemia    as teenager  . History of rectal bleeding   . History of shingles   . History of vertigo   . Hypothyroidism   . Internal hemorrhoids   . Interstitial cystitis    resolved  . Low blood pressure   . PONV (postoperative nausea and vomiting)   . Poor circulation of extremity    bilateral lower  . Sinus tachycardia    History of  . Thyroid disease    Phreesia 05/06/2020  . Wears glasses     Past Surgical History:  Procedure Laterality Date  . APPENDECTOMY  1994  . BREAST ENHANCEMENT SURGERY    . Cardiometabolic Testing  75/9163   normal effort with peak VO2 of 73% predicted, HR accelerated at 97% predcited, HR on VO2 curve showed steep invline at anaerobic threshold - low risk, abnormal  . CERVICAL CONIZATION W/BX    . CHOLECYSTECTOMY    . CYSTOSCOPY W/ DILATION OF BLADDER    . DILATION AND EVACUATION     miscarriage x3  . GYNECOLOGIC CRYOSURGERY    . HIGH RESOLUTION ANOSCOPY N/A 05/12/2019   Procedure: HIGH RESOLUTION ANOSCOPY, ANAL BIOPSY;  Surgeon: Leighton Ruff, MD;  Location: [facility];  Service: General;  Laterality: N/A;  . SHOULDER SURGERY Left    Muscle detachment  .  TONSILLECTOMY    . TRANSTHORACIC ECHOCARDIOGRAM  03/2005   EF 55-65%, normal LV systolic function - no LV regional wall motion abnormalities  . VAGINAL WOUND CLOSURE / REPAIR     after vaginal delivery  . WISDOM TOOTH EXTRACTION      Current Medications: No outpatient medications have been marked as taking for the 06/01/20 encounter (Appointment) with Donato Heinz, MD.     Allergies:   Azo [phenazopyridine hcl], Flagyl [metronidazole], Other, Iodine, Nitrofurantoin monohyd macro, Lodine [etodolac], and Macrolides and ketolides   Social History   Socioeconomic History  . Marital status: Married    Spouse  name: Not on file  . Number of children: 3  . Years of education: 93yr coll.  . Highest education level: Not on file  Occupational History  . Occupation: singer/actor  Tobacco Use  . Smoking status: Never Smoker  . Smokeless tobacco: Never Used  Vaping Use  . Vaping Use: Never used  Substance and Sexual Activity  . Alcohol use: Yes    Comment: occasionally (twice yearly)  . Drug use: No  . Sexual activity: Not Currently    Partners: Male    Birth control/protection: Post-menopausal  Other Topics Concern  . Not on file  Social History Narrative   Right handed   Social Determinants of Health   Financial Resource Strain: Not on file  Food Insecurity: Not on file  Transportation Needs: Not on file  Physical Activity: Not on file  Stress: Not on file  Social Connections: Not on file     Family History: The patient's family history includes Breast cancer in her maternal grandmother; Colon polyps in her mother; Depression in her sister; Diverticulitis in her mother; Heart disease in her paternal grandfather and paternal grandmother; Hyperlipidemia in her father; Hypertension in her father and mother. There is no history of Colon cancer, Esophageal cancer, or Rectal cancer.  ROS:   Please see the history of present illness.     All other systems reviewed and are negative.  EKGs/Labs/Other Studies Reviewed:    The following studies were reviewed today:   EKG:  EKG is ordered today.  The ekg ordered today demonstrates normal sinus rhythm, rate 81, no ST/T abnormalities  Recent Labs: 02/29/2020: ALT 15; BUN 15; Creatinine, Ser 0.74; Hemoglobin 14.4; Platelets 324; Potassium 4.4; Sodium 146 03/21/2020: TSH 1.280  Recent Lipid Panel    Component Value Date/Time   CHOL 180 02/29/2020 1550   TRIG 163 (H) 02/29/2020 1550   HDL 72 02/29/2020 1550   CHOLHDL 2.5 02/29/2020 1550   CHOLHDL 3.1 06/09/2011 1610   VLDL 40 06/09/2011 1610   LDLCALC 81 02/29/2020 1550    Physical  Exam:    VS:  LMP 06/21/2015 (Approximate)     Wt Readings from Last 3 Encounters:  05/09/20 135 lb 6.4 oz (61.4 kg)  03/21/20 130 lb 3.2 oz (59.1 kg)  03/12/20 129 lb 3.2 oz (58.6 kg)     GEN:  Well nourished, well developed in no acute distress HEENT: Normal NECK: No JVD; No carotid bruits LYMPHATICS: No lymphadenopathy CARDIAC: RRR, no murmurs, rubs, gallops RESPIRATORY:  Clear to auscultation without rales, wheezing or rhonchi  ABDOMEN: Soft, non-tender, non-distended MUSCULOSKELETAL:  No edema; No deformity  SKIN: Warm and dry NEUROLOGIC:  Alert and oriented x 3 PSYCHIATRIC:  Normal affect   ASSESSMENT:    No diagnosis found. PLAN:     Amaurosis fugax: Reported transient vision loss in right eye that lasted 3 to  5 minutes 2 weeks ago.  Reports she has been seen by her ophthalmologist.  Carotid duplex, echocardiogram, and Zio patch x2 weeks unremarkable.  Check ESR/CRP.  She was referred to neurology for further evaluation.  MRI/MRI brain and hypercoagulable work-up were done, which were unremarkable  Tachycardia/palpitations.  Appears controlled on metoprolol, will continue  Aortic regurgitation: Mild to moderate on echo 03/26/2020.  Repeat echocardiogram in 1 year.  RTC in***  Medication Adjustments/Labs and Tests Ordered: Current medicines are reviewed at length with the patient today.  Concerns regarding medicines are outlined above.  No orders of the defined types were placed in this encounter.  No orders of the defined types were placed in this encounter.   There are no Patient Instructions on file for this visit.   Signed, Donato Heinz, MD  05/27/2020 10:24 PM    [place]

## 2020-05-28 DIAGNOSIS — K6282 Dysplasia of anus: Secondary | ICD-10-CM | POA: Diagnosis not present

## 2020-05-30 ENCOUNTER — Encounter: Payer: Self-pay | Admitting: Registered Nurse

## 2020-05-30 ENCOUNTER — Telehealth: Payer: Self-pay | Admitting: Registered Nurse

## 2020-05-30 NOTE — Progress Notes (Signed)
Established Patient Office Visit  Subjective:  Patient ID: Kimberly Macdonald, female    DOB: Oct 07, 1965  Age: 55 y.o. MRN: [id number]  CC:  Chief Complaint  Patient presents with  . Medication Refill    Patient states she is here for an medication refill. Patient would also like to discusss breakout on her face since yesterday.    HPI Kimberly Macdonald presents for med refill, neck mass, acne  Med refill Metoprolol 50mg  ER po qd.  Good effect, no AEs.  Valtrex 500mg  PO qd.  Good effect, no AEs.  Neck mass: Soft mass on R side of neck, anterior. Unsure of how long it's been there but noticed it not long ago. Concerned that this is r/t thyroid given hx of thyroid dysfunction. Requesting imaging.  Acne: Breakout on face, suspects related to mask. Has had acne in past. Has tried OTCs without relief. Has been on doxycycline in the past for acne with good effect. Hoping for another course.    Past Medical History:  Diagnosis Date  . Acne   . Anal intraepithelial neoplasia I   . Cataract    right eye, getting a second opinion  . Cervical dysplasia   . Depression   . Diverticulosis   . GERD (gastroesophageal reflux disease)    with pregnancy  . Heart palpitations    History of  . History of colon polyps   . History of iron deficiency anemia    as teenager  . History of rectal bleeding   . History of shingles   . History of vertigo   . Hypothyroidism   . Internal hemorrhoids   . Interstitial cystitis    resolved  . Low blood pressure   . PONV (postoperative nausea and vomiting)   . Poor circulation of extremity    bilateral lower  . Sinus tachycardia    History of  . Thyroid disease    Phreesia 05/06/2020  . Wears glasses     Past Surgical History:  Procedure Laterality Date  . APPENDECTOMY  1994  . BREAST ENHANCEMENT SURGERY    . Cardiometabolic Testing  12/6576   normal effort with peak VO2 of 73% predicted, HR accelerated at 97% predcited, HR on VO2  curve showed steep invline at anaerobic threshold - low risk, abnormal  . CERVICAL CONIZATION W/BX    . CHOLECYSTECTOMY    . CYSTOSCOPY W/ DILATION OF BLADDER    . DILATION AND EVACUATION     miscarriage x3  . GYNECOLOGIC CRYOSURGERY    . HIGH RESOLUTION ANOSCOPY N/A 05/12/2019   Procedure: HIGH RESOLUTION ANOSCOPY, ANAL BIOPSY;  Surgeon: Leighton Ruff, MD;  Location: [facility];  Service: General;  Laterality: N/A;  . SHOULDER SURGERY Left    Muscle detachment  . TONSILLECTOMY    . TRANSTHORACIC ECHOCARDIOGRAM  03/2005   EF 55-65%, normal LV systolic function - no LV regional wall motion abnormalities  . VAGINAL WOUND CLOSURE / REPAIR     after vaginal delivery  . WISDOM TOOTH EXTRACTION      Family History  Problem Relation Age of Onset  . Hypertension Mother   . Diverticulitis Mother        part of colon removed  . Colon polyps Mother   . Hyperlipidemia Father   . Hypertension Father   . Depression Sister   . Breast cancer Maternal Grandmother   . Heart disease Paternal Grandmother        MI   .  Heart disease Paternal Grandfather        MI  . Colon cancer Neg Hx   . Esophageal cancer Neg Hx   . Rectal cancer Neg Hx     Social History   Socioeconomic History  . Marital status: Married    Spouse name: Not on file  . Number of children: 3  . Years of education: 15yr coll.  . Highest education level: Not on file  Occupational History  . Occupation: singer/actor  Tobacco Use  . Smoking status: Never Smoker  . Smokeless tobacco: Never Used  Vaping Use  . Vaping Use: Never used  Substance and Sexual Activity  . Alcohol use: Yes    Comment: occasionally (twice yearly)  . Drug use: No  . Sexual activity: Not Currently    Partners: Male    Birth control/protection: Post-menopausal  Other Topics Concern  . Not on file  Social History Narrative   Right handed   Social Determinants of Health   Financial Resource Strain: Not on file  Food  Insecurity: Not on file  Transportation Needs: Not on file  Physical Activity: Not on file  Stress: Not on file  Social Connections: Not on file  Intimate Partner Violence: Not on file    Outpatient Medications Prior to Visit  Medication Sig Dispense Refill  . b complex vitamins tablet Take 1 tablet by mouth daily.    Marland Kitchen co-enzyme Q-10 30 MG capsule Take 300 mg by mouth daily.     Marland Kitchen L-Arginine 1000 MG TABS Take by mouth.     . levothyroxine (SYNTHROID, LEVOTHROID) 88 MCG tablet Take 88 mcg by mouth every evening.     . Multiple Vitamin (MULTIVITAMIN) tablet Take 1 tablet by mouth daily.    . Lactobacillus (PROBIOTIC ACIDOPHILUS PO) Take by mouth.    . metoprolol succinate (TOPROL-XL) 50 MG 24 hr tablet TAKE 1 TABLET BY MOUTH WITH OR IMMEDIATELY FOLLOWING A MEAL. (Patient taking differently: Take 50 mg by mouth. TAKE 1 TABLET BY MOUTH WITH OR IMMEDIATELY FOLLOWING A MEAL. Pt takes at night time.) 90 tablet 0  . valACYclovir (VALTREX) 500 MG tablet     . phentermine 37.5 MG capsule Take 37.5 mg by mouth every morning. Taking 1/2 daily    . traZODone (DESYREL) 50 MG tablet Take 50 mg by mouth at bedtime.     No facility-administered medications prior to visit.    Allergies  Allergen Reactions  . Azo [Phenazopyridine Hcl] Hives  . Flagyl [Metronidazole] Hives  . Other Hives, Itching, Rash and Swelling  . Iodine Hives, Itching, Rash and Swelling  . Nitrofurantoin Monohyd Macro Rash    Flu like sxs  . Lodine [Etodolac]     "Advil is ok"  . Macrolides And Ketolides     ROS Review of Systems Per hpi     Objective:    Physical Exam Vitals and nursing note reviewed.  Constitutional:      General: She is not in acute distress.    Appearance: Normal appearance. She is normal weight. She is not ill-appearing, toxic-appearing or diaphoretic.  Neck:     Thyroid: Thyroid mass (question of mass. seems more superficial than thyroid. likely soft tissue swelling) present.     Vascular:  No carotid bruit.  Cardiovascular:     Rate and Rhythm: Normal rate and regular rhythm.     Heart sounds: Normal heart sounds. No murmur heard. No friction rub. No gallop.   Pulmonary:     Effort:  Pulmonary effort is normal. No respiratory distress.     Breath sounds: Normal breath sounds. No stridor. No wheezing, rhonchi or rales.  Chest:     Chest wall: No tenderness.  Musculoskeletal:     Cervical back: Normal range of motion and neck supple. No rigidity or tenderness.  Lymphadenopathy:     Cervical: No cervical adenopathy.  Skin:    General: Skin is warm and dry.     Findings: Acne present.  Neurological:     General: No focal deficit present.     Mental Status: She is alert and oriented to person, place, and time. Mental status is at baseline.  Psychiatric:        Mood and Affect: Mood normal.        Behavior: Behavior normal.        Thought Content: Thought content normal.        Judgment: Judgment normal.     BP 130/79   Pulse 73   Temp 98 F (36.7 C) (Temporal)   Resp 18   Ht 5\' 2"  (1.575 m)   Wt 130 lb 3.2 oz (59.1 kg)   LMP 06/21/2015 (Approximate)   SpO2 96%   BMI 23.81 kg/m  Wt Readings from Last 3 Encounters:  05/09/20 135 lb 6.4 oz (61.4 kg)  03/21/20 130 lb 3.2 oz (59.1 kg)  03/12/20 129 lb 3.2 oz (58.6 kg)     Health Maintenance Due  Topic Date Due  . HIV Screening  Never done  . PAP SMEAR-Modifier  Never done  . COVID-19 Vaccine (3 - Booster for Pfizer series) 02/22/2020    There are no preventive care reminders to display for this patient.  Lab Results  Component Value Date   TSH 1.280 03/21/2020   Lab Results  Component Value Date   WBC 7.9 02/29/2020   HGB 14.4 02/29/2020   HCT 42.7 02/29/2020   MCV 88 02/29/2020   PLT 324 02/29/2020   Lab Results  Component Value Date   NA 146 (H) 02/29/2020   K 4.4 02/29/2020   CO2 30 (H) 02/29/2020   GLUCOSE 85 02/29/2020   BUN 15 02/29/2020   CREATININE 0.74 02/29/2020   BILITOT 0.8  02/29/2020   ALKPHOS 100 02/29/2020   AST 19 02/29/2020   ALT 15 02/29/2020   PROT 7.0 02/29/2020   ALBUMIN 4.7 02/29/2020   CALCIUM 10.2 02/29/2020   Lab Results  Component Value Date   CHOL 180 02/29/2020   Lab Results  Component Value Date   HDL 72 02/29/2020   Lab Results  Component Value Date   LDLCALC 81 02/29/2020   Lab Results  Component Value Date   TRIG 163 (H) 02/29/2020   Lab Results  Component Value Date   CHOLHDL 2.5 02/29/2020   No results found for: HGBA1C    Assessment & Plan:   Problem List Items Addressed This Visit   None   Visit Diagnoses    Elevated C-reactive protein (CRP)    -  Primary   Relevant Orders   ANA w/Reflex (Completed)   Thyroid Panel With TSH (Completed)   C-reactive protein (Completed)   Neck mass       Relevant Orders   US THYROID (Completed)      No orders of the defined types were placed in this encounter.   Follow-up: No follow-ups on file.   PLAN  Will draw labs. Concern with neck mass and outbreak on face for autoimmunity.   Will  order imaging to rule out thyroid involvement with swelling.  Med refills pending lab results  Patient encouraged to call clinic with any questions, comments, or concerns.  Maximiano Coss, NP

## 2020-05-30 NOTE — Telephone Encounter (Signed)
Pt was last seen on 05/09/20. Please advise if this rx is appropriate.

## 2020-05-30 NOTE — Telephone Encounter (Signed)
Pt called to see if she can get a refill for doxycycline (VIBRA-TABS) 100 MG tablet For infected acne/ she states she needs another round because she is still having the issue / please advise and send to     [id number] - [place], [address], [place] [address]  Phone:  [phone] Fax:  [phone]

## 2020-06-01 ENCOUNTER — Ambulatory Visit: Payer: BC Managed Care – PPO | Admitting: Cardiology

## 2020-06-01 NOTE — Telephone Encounter (Signed)
Pt is checking on status of this message. She says her face is still infected. Please advise.

## 2020-06-03 NOTE — Progress Notes (Deleted)
Cardiology Office Note:    Date:  06/03/2020   ID:  Kimberly Macdonald, DOB 01/30/1966, MRN [id number]  PCP:  Maximiano Coss, NP  Cardiologist:  No primary care provider on file.  Electrophysiologist:  None   Referring MD: Maximiano Coss, NP   No chief complaint on file.   History of Present Illness:    Kimberly Macdonald is a 55 y.o. female with a hx of hypothyroidism who presents for follow-up.  She was referred by Dr. Pamella Pert for evaluation of tachycardia, initially seen on 02/29/2020.  She previously followed with Dr. Debara Pickett, last seen in 2014.  She underwent MET test on 02/23/2012 which showed normal effort (peak VO2 73%), markedly accelerated heart rate at 97% predicted and a heart rate on VO2 curve showed a steep incline at anaerobic threshold with some flattening of the VO2 curve consistent with probable small vessel ischemia.  She was started on metoprolol.  Reports that she continues to take metoprolol and palpitations are under control while on the medication.  She denies any chest pain or dyspnea.  Reports she exercises by boxing regularly, lifting weights, and doing the elliptical.  Does report intermittent lightheadedness with standing.  Denies any syncope.  Reports 2 weeks ago had transient vision loss in her right eye.  Vision returned in 3 to 5 minutes.  Carotid duplex on 03/06/2020 showed no evidence of carotid stenosis.  Temporal artery ultrasound showed patent temporal arteries but mild wall thickening on the left.  Echocardiogram on 03/26/2020 showed normal biventricular function, mild to moderate aortic regurgitation.  Zio patch x14 days on 04/04/2020 showed short episode of junctional rhythm, lasting 6 beats.  No other significant abnormalities.  Since last clinic visit,  Past Medical History:  Diagnosis Date  . Acne   . Anal intraepithelial neoplasia I   . Cataract    right eye, getting a second opinion  . Cervical dysplasia   . Depression   . Diverticulosis    . GERD (gastroesophageal reflux disease)    with pregnancy  . Heart palpitations    History of  . History of colon polyps   . History of iron deficiency anemia    as teenager  . History of rectal bleeding   . History of shingles   . History of vertigo   . Hypothyroidism   . Internal hemorrhoids   . Interstitial cystitis    resolved  . Low blood pressure   . PONV (postoperative nausea and vomiting)   . Poor circulation of extremity    bilateral lower  . Sinus tachycardia    History of  . Thyroid disease    Phreesia 05/06/2020  . Wears glasses     Past Surgical History:  Procedure Laterality Date  . APPENDECTOMY  1994  . BREAST ENHANCEMENT SURGERY    . Cardiometabolic Testing  32/3557   normal effort with peak VO2 of 73% predicted, HR accelerated at 97% predcited, HR on VO2 curve showed steep invline at anaerobic threshold - low risk, abnormal  . CERVICAL CONIZATION W/BX    . CHOLECYSTECTOMY    . CYSTOSCOPY W/ DILATION OF BLADDER    . DILATION AND EVACUATION     miscarriage x3  . GYNECOLOGIC CRYOSURGERY    . HIGH RESOLUTION ANOSCOPY N/A 05/12/2019   Procedure: HIGH RESOLUTION ANOSCOPY, ANAL BIOPSY;  Surgeon: Leighton Ruff, MD;  Location: [facility];  Service: General;  Laterality: N/A;  . SHOULDER SURGERY Left    Muscle detachment  .  TONSILLECTOMY    . TRANSTHORACIC ECHOCARDIOGRAM  03/2005   EF 55-65%, normal LV systolic function - no LV regional wall motion abnormalities  . VAGINAL WOUND CLOSURE / REPAIR     after vaginal delivery  . WISDOM TOOTH EXTRACTION      Current Medications: No outpatient medications have been marked as taking for the 06/04/20 encounter (Appointment) with Donato Heinz, MD.     Allergies:   Azo [phenazopyridine hcl], Flagyl [metronidazole], Other, Iodine, Nitrofurantoin monohyd macro, Lodine [etodolac], and Macrolides and ketolides   Social History   Socioeconomic History  . Marital status: Married    Spouse  name: Not on file  . Number of children: 3  . Years of education: 31yrcoll.  . Highest education level: Not on file  Occupational History  . Occupation: singer/actor  Tobacco Use  . Smoking status: Never Smoker  . Smokeless tobacco: Never Used  Vaping Use  . Vaping Use: Never used  Substance and Sexual Activity  . Alcohol use: Yes    Comment: occasionally (twice yearly)  . Drug use: No  . Sexual activity: Not Currently    Partners: Male    Birth control/protection: Post-menopausal  Other Topics Concern  . Not on file  Social History Narrative   Right handed   Social Determinants of Health   Financial Resource Strain: Not on file  Food Insecurity: Not on file  Transportation Needs: Not on file  Physical Activity: Not on file  Stress: Not on file  Social Connections: Not on file     Family History: The patient's family history includes Breast cancer in her maternal grandmother; Colon polyps in her mother; Depression in her sister; Diverticulitis in her mother; Heart disease in her paternal grandfather and paternal grandmother; Hyperlipidemia in her father; Hypertension in her father and mother. There is no history of Colon cancer, Esophageal cancer, or Rectal cancer.  ROS:   Please see the history of present illness.     All other systems reviewed and are negative.  EKGs/Labs/Other Studies Reviewed:    The following studies were reviewed today:   EKG:  EKG is ordered today.  The ekg ordered today demonstrates normal sinus rhythm, rate 81, no ST/T abnormalities  Recent Labs: 02/29/2020: ALT 15; BUN 15; Creatinine, Ser 0.74; Hemoglobin 14.4; Platelets 324; Potassium 4.4; Sodium 146 03/21/2020: TSH 1.280  Recent Lipid Panel    Component Value Date/Time   CHOL 180 02/29/2020 1550   TRIG 163 (H) 02/29/2020 1550   HDL 72 02/29/2020 1550   CHOLHDL 2.5 02/29/2020 1550   CHOLHDL 3.1 06/09/2011 1610   VLDL 40 06/09/2011 1610   LDLCALC 81 02/29/2020 1550    Physical  Exam:    VS:  LMP 06/21/2015 (Approximate)     Wt Readings from Last 3 Encounters:  05/09/20 135 lb 6.4 oz (61.4 kg)  03/21/20 130 lb 3.2 oz (59.1 kg)  03/12/20 129 lb 3.2 oz (58.6 kg)     GEN:  Well nourished, well developed in no acute distress HEENT: Normal NECK: No JVD; No carotid bruits LYMPHATICS: No lymphadenopathy CARDIAC: RRR, no murmurs, rubs, gallops RESPIRATORY:  Clear to auscultation without rales, wheezing or rhonchi  ABDOMEN: Soft, non-tender, non-distended MUSCULOSKELETAL:  No edema; No deformity  SKIN: Warm and dry NEUROLOGIC:  Alert and oriented x 3 PSYCHIATRIC:  Normal affect   ASSESSMENT:    No diagnosis found. PLAN:     Amaurosis fugax: Reported transient vision loss in right eye that lasted 3 to  5 minutes 2 weeks ago.  Reports she has been seen by her ophthalmologist.  Carotid duplex, echocardiogram, and Zio patch x2 weeks unremarkable.  Check ESR/CRP.  She was referred to neurology for further evaluation.  MRI/MRI brain and hypercoagulable work-up were done, which were unremarkable  Tachycardia/palpitations.  Appears controlled on metoprolol, will continue  Aortic regurgitation: Mild to moderate on echo 03/26/2020.  Repeat echocardiogram in 1 year.  RTC in***  Medication Adjustments/Labs and Tests Ordered: Current medicines are reviewed at length with the patient today.  Concerns regarding medicines are outlined above.  No orders of the defined types were placed in this encounter.  No orders of the defined types were placed in this encounter.   There are no Patient Instructions on file for this visit.   Signed, Donato Heinz, MD  06/03/2020 10:05 PM    [place]

## 2020-06-04 ENCOUNTER — Ambulatory Visit: Payer: BC Managed Care – PPO | Admitting: Cardiology

## 2020-06-04 ENCOUNTER — Other Ambulatory Visit: Payer: Self-pay | Admitting: Registered Nurse

## 2020-06-04 DIAGNOSIS — L700 Acne vulgaris: Secondary | ICD-10-CM

## 2020-06-04 MED ORDER — DOXYCYCLINE HYCLATE 100 MG PO TABS: 100.0000 mg | ORAL_TABLET | Freq: Two times a day (BID) | ORAL | 0 refills | Status: DC

## 2020-06-04 NOTE — Telephone Encounter (Signed)
I have attempted to call pt to get a f/u to see how you are doing okay.  Rich, please advise if this antibiotic is appropriate.

## 2020-06-12 DIAGNOSIS — R928 Other abnormal and inconclusive findings on diagnostic imaging of breast: Secondary | ICD-10-CM | POA: Diagnosis not present

## 2020-06-12 DIAGNOSIS — N6310 Unspecified lump in the right breast, unspecified quadrant: Secondary | ICD-10-CM | POA: Diagnosis not present

## 2020-06-12 DIAGNOSIS — N6341 Unspecified lump in right breast, subareolar: Secondary | ICD-10-CM | POA: Diagnosis not present

## 2020-06-13 ENCOUNTER — Other Ambulatory Visit: Payer: Self-pay | Admitting: Registered Nurse

## 2020-06-13 DIAGNOSIS — R000 Tachycardia, unspecified: Secondary | ICD-10-CM

## 2020-06-13 NOTE — Telephone Encounter (Signed)
Requested Prescriptions  Pending Prescriptions Disp Refills  . metoprolol succinate (TOPROL-XL) 50 MG 24 hr tablet [Pharmacy Med Name: METOPROLOL SUCC ER 50 MG TAB] 90 tablet 0    Sig: TAKE 1 TABLET BY MOUTH NIGHTLY WITH OR IMMEDIATELY FOLLOWING A MEAL.     Cardiovascular:  Beta Blockers Passed - 06/13/2020  1:25 AM      Passed - Last BP in normal range    BP Readings from Last 1 Encounters:  05/09/20 119/60         Passed - Last Heart Rate in normal range    Pulse Readings from Last 1 Encounters:  05/09/20 80         Passed - Valid encounter within last 6 months    Recent Outpatient Visits          1 month ago Encounter to establish care   [facility], NP   2 months ago Elevated C-reactive protein (CRP)   [facility], NP   1 year ago Intermittent diarrhea   [facility] at [facility], Lilia Argue, MD   1 year ago Tick bite of left scapular region, initial encounter   [facility] at [facility], Rex Kras, MD   1 year ago Special screening for malignant neoplasms, colon   [facility] at [facility], Arlie Solomons, MD      Future Appointments            In 4 months Maximiano Coss, NP Primary Care at [place], [facility]

## 2020-07-02 DIAGNOSIS — L03211 Cellulitis of face: Secondary | ICD-10-CM | POA: Diagnosis not present

## 2020-07-14 DIAGNOSIS — R350 Frequency of micturition: Secondary | ICD-10-CM | POA: Diagnosis not present

## 2020-07-26 DIAGNOSIS — R309 Painful micturition, unspecified: Secondary | ICD-10-CM | POA: Diagnosis not present

## 2020-08-09 DIAGNOSIS — H524 Presbyopia: Secondary | ICD-10-CM | POA: Diagnosis not present

## 2020-08-09 DIAGNOSIS — G453 Amaurosis fugax: Secondary | ICD-10-CM | POA: Diagnosis not present

## 2020-09-04 NOTE — Progress Notes (Deleted)
NEUROLOGY FOLLOW UP OFFICE NOTE  Kimberly Macdonald [id number]  Assessment/Plan:   1.  Right amaurosis fugax  ***  Subjective:  Kimberly Macdonald is a 55 year old right-handed female with hypothyroidism who presents for follows up for right sided amaurosis fugax.  UPDATE: Hypercoagulable panel with Factor V Leiden was negative.  MRI of brain with and without contrast and MRA of head on 04/01/2020 personally reviewed showed mild chronic small vessel ischemic changes but otherwise unremarkable.  Recommended to start ASA 81mg  daily.  ***  HISTORY: In early October 2021, she was looking at her laptop and had vision loss in right eye, like window shade coming down and couldn't see for 7 minutes (complete black out of vision).  No ocular pain or headache.  She saw an ophthalmologist and had an unremarkable exam.  Does have a cataract in the right eye.  She has history of palpitations for which are controlled with metoprolol.  She has no history of hypertension, hyperlipidemia, diabetes, tobacco use or thrombosis.  She exercises regularly with weights, using the elliptical and boxing.  She followed up with cardiology.  Carotid ultrasound from 03/06/2020 showed no hemodynamically significant stenosis of the bilateral ICAs.  Sed rate was 19 but CRP was elevated at 44. TSH was 1.390 and lipid panel with total chol 180, TG 163, HDL 72 and LDL 81.  Due to elevated CRP, temporal artery [place] was performed on 03/08/2020, which demonstrated patent temporal arteries but mild wall thickening on the left.  Long term cardiac event monitor and echocardiogram have been ordered and still pending.  Since then, she notes occasional mild pressure around her right eye.  She also feels a little more tired.    No recurrence or prior history of similar episodes.  No history of blood clots or headaches.  She reports occasional episodes of visual aura (zig zag lines and such) while driving at night.  She has history of 4  miscarriages.    PAST MEDICAL HISTORY: Past Medical History:  Diagnosis Date  . Acne   . Anal intraepithelial neoplasia I   . Cataract    right eye, getting a second opinion  . Cervical dysplasia   . Depression   . Diverticulosis   . GERD (gastroesophageal reflux disease)    with pregnancy  . Heart palpitations    History of  . History of colon polyps   . History of iron deficiency anemia    as teenager  . History of rectal bleeding   . History of shingles   . History of vertigo   . Hypothyroidism   . Internal hemorrhoids   . Interstitial cystitis    resolved  . Low blood pressure   . PONV (postoperative nausea and vomiting)   . Poor circulation of extremity    bilateral lower  . Sinus tachycardia    History of  . Thyroid disease    Phreesia 05/06/2020  . Wears glasses     MEDICATIONS: Current Outpatient Medications on File Prior to Visit  Medication Sig Dispense Refill  . b complex vitamins tablet Take 1 tablet by mouth daily.    Marland Kitchen co-enzyme Q-10 30 MG capsule Take 300 mg by mouth daily.     Marland Kitchen doxycycline (VIBRA-TABS) 100 MG tablet Take 1 tablet (100 mg total) by mouth 2 (two) times daily. 20 tablet 0  . L-Arginine 1000 MG TABS Take by mouth.     . levothyroxine (SYNTHROID, LEVOTHROID) 88 MCG tablet Take 88  mcg by mouth every evening.     . metoprolol succinate (TOPROL-XL) 50 MG 24 hr tablet TAKE 1 TABLET BY MOUTH NIGHTLY WITH OR IMMEDIATELY FOLLOWING A MEAL. 90 tablet 1  . Multiple Vitamin (MULTIVITAMIN) tablet Take 1 tablet by mouth daily.    . phentermine 37.5 MG capsule Take 37.5 mg by mouth every morning. Taking 1/2 daily    . valACYclovir (VALTREX) 500 MG tablet Take 1 tablet (500mg ) by mouth twice daily PRN for outbreaks. 90 tablet 3   No current facility-administered medications on file prior to visit.    ALLERGIES: Allergies  Allergen Reactions  . Azo [Phenazopyridine Hcl] Hives  . Flagyl [Metronidazole] Hives  . Other Hives, Itching, Rash and  Swelling  . Iodine Hives, Itching, Rash and Swelling  . Nitrofurantoin Monohyd Macro Rash    Flu like sxs  . Lodine [Etodolac]     "Advil is ok"  . Macrolides And Ketolides     FAMILY HISTORY: Family History  Problem Relation Age of Onset  . Hypertension Mother   . Diverticulitis Mother        part of colon removed  . Colon polyps Mother   . Hyperlipidemia Father   . Hypertension Father   . Depression Sister   . Breast cancer Maternal Grandmother   . Heart disease Paternal Grandmother        MI   . Heart disease Paternal Grandfather        MI  . Colon cancer Neg Hx   . Esophageal cancer Neg Hx   . Rectal cancer Neg Hx       Objective:  *** General: No acute distress.  Patient appears well-groomed.   Head:  Normocephalic/atraumatic Eyes:  Fundi examined but not visualized Neck: supple, no paraspinal tenderness, full range of motion Heart:  Regular rate and rhythm Lungs:  Clear to auscultation bilaterally Back: No paraspinal tenderness Neurological Exam: alert and oriented to person, place, and time. Attention span and concentration intact, recent and remote memory intact, fund of knowledge intact.  Speech fluent and not dysarthric, language intact.  CN II-XII intact. Bulk and tone normal, muscle strength 5/5 throughout.  Sensation to light touch, temperature and vibration intact.  Deep tendon reflexes 2+ throughout, toes downgoing.  Finger to nose and heel to shin testing intact.  Gait normal, Romberg negative.     Metta Clines, DO  CC: Maximiano Coss, NP

## 2020-09-05 ENCOUNTER — Ambulatory Visit: Payer: BC Managed Care – PPO | Admitting: Neurology

## 2020-09-13 DIAGNOSIS — E039 Hypothyroidism, unspecified: Secondary | ICD-10-CM | POA: Diagnosis not present

## 2020-09-13 DIAGNOSIS — Z01419 Encounter for gynecological examination (general) (routine) without abnormal findings: Secondary | ICD-10-CM | POA: Diagnosis not present

## 2020-09-13 DIAGNOSIS — Z6825 Body mass index (BMI) 25.0-25.9, adult: Secondary | ICD-10-CM | POA: Diagnosis not present

## 2020-09-13 DIAGNOSIS — N959 Unspecified menopausal and perimenopausal disorder: Secondary | ICD-10-CM | POA: Diagnosis not present

## 2020-09-13 DIAGNOSIS — Z01411 Encounter for gynecological examination (general) (routine) with abnormal findings: Secondary | ICD-10-CM | POA: Diagnosis not present

## 2020-09-13 DIAGNOSIS — Z124 Encounter for screening for malignant neoplasm of cervix: Secondary | ICD-10-CM | POA: Diagnosis not present

## 2020-10-15 DIAGNOSIS — D1801 Hemangioma of skin and subcutaneous tissue: Secondary | ICD-10-CM | POA: Diagnosis not present

## 2020-10-15 DIAGNOSIS — L570 Actinic keratosis: Secondary | ICD-10-CM | POA: Diagnosis not present

## 2020-10-15 DIAGNOSIS — L820 Inflamed seborrheic keratosis: Secondary | ICD-10-CM | POA: Diagnosis not present

## 2020-10-15 DIAGNOSIS — L821 Other seborrheic keratosis: Secondary | ICD-10-CM | POA: Diagnosis not present

## 2020-11-03 ENCOUNTER — Other Ambulatory Visit: Payer: Self-pay | Admitting: Registered Nurse

## 2020-11-06 ENCOUNTER — Ambulatory Visit: Payer: Self-pay | Admitting: Registered Nurse

## 2020-11-07 DIAGNOSIS — L989 Disorder of the skin and subcutaneous tissue, unspecified: Secondary | ICD-10-CM | POA: Diagnosis not present

## 2020-11-07 DIAGNOSIS — D485 Neoplasm of uncertain behavior of skin: Secondary | ICD-10-CM | POA: Diagnosis not present

## 2020-12-04 DIAGNOSIS — L988 Other specified disorders of the skin and subcutaneous tissue: Secondary | ICD-10-CM | POA: Diagnosis not present

## 2020-12-17 DIAGNOSIS — Z9882 Breast implant status: Secondary | ICD-10-CM | POA: Diagnosis not present

## 2020-12-17 DIAGNOSIS — R922 Inconclusive mammogram: Secondary | ICD-10-CM | POA: Diagnosis not present

## 2020-12-18 DIAGNOSIS — Z713 Dietary counseling and surveillance: Secondary | ICD-10-CM | POA: Diagnosis not present

## 2020-12-18 DIAGNOSIS — Z6825 Body mass index (BMI) 25.0-25.9, adult: Secondary | ICD-10-CM | POA: Diagnosis not present

## 2021-01-15 ENCOUNTER — Telehealth: Payer: Self-pay | Admitting: Cardiology

## 2021-01-15 NOTE — Telephone Encounter (Signed)
Called and explained to pt that per Dr Schumann/radiologist at this time she will need to do ECHO yearly. Pt verbalizes understanding and is willing to schedule ECHO in November so there are no insurance issues. All questions answered, no further questions.

## 2021-01-15 NOTE — Telephone Encounter (Signed)
Patient was contacted to schedule an Echo. She states she just had one, and she wanted to know what Dr. Gardiner Rhyme was looking for with this Echo.  The patient agrees to do the echo but just would like to know why.

## 2021-02-28 ENCOUNTER — Other Ambulatory Visit: Payer: Self-pay | Admitting: Registered Nurse

## 2021-02-28 DIAGNOSIS — R000 Tachycardia, unspecified: Secondary | ICD-10-CM

## 2021-03-14 ENCOUNTER — Ambulatory Visit (HOSPITAL_COMMUNITY): Payer: BC Managed Care – PPO | Attending: Cardiology

## 2021-03-14 ENCOUNTER — Other Ambulatory Visit: Payer: Self-pay

## 2021-03-14 DIAGNOSIS — I351 Nonrheumatic aortic (valve) insufficiency: Secondary | ICD-10-CM | POA: Insufficient documentation

## 2021-03-14 LAB — ECHOCARDIOGRAM COMPLETE
Area-P 1/2: 3.65 cm2
P 1/2 time: 536 msec
S' Lateral: 2.7 cm

## 2021-09-23 ENCOUNTER — Other Ambulatory Visit: Payer: Self-pay | Admitting: Registered Nurse

## 2021-09-23 DIAGNOSIS — R000 Tachycardia, unspecified: Secondary | ICD-10-CM

## 2021-10-01 ENCOUNTER — Other Ambulatory Visit: Payer: Self-pay

## 2021-10-01 ENCOUNTER — Ambulatory Visit: Payer: Commercial Managed Care - HMO | Admitting: Registered Nurse

## 2021-10-01 ENCOUNTER — Encounter: Payer: Self-pay | Admitting: Registered Nurse

## 2021-10-01 VITALS — BP 110/64 | HR 80 | Temp 98.0°F | Resp 17 | Ht 62.0 in | Wt 130.2 lb

## 2021-10-01 DIAGNOSIS — B009 Herpesviral infection, unspecified: Secondary | ICD-10-CM | POA: Diagnosis not present

## 2021-10-01 DIAGNOSIS — T50905A Adverse effect of unspecified drugs, medicaments and biological substances, initial encounter: Secondary | ICD-10-CM

## 2021-10-01 DIAGNOSIS — R000 Tachycardia, unspecified: Secondary | ICD-10-CM

## 2021-10-01 DIAGNOSIS — R21 Rash and other nonspecific skin eruption: Secondary | ICD-10-CM | POA: Diagnosis not present

## 2021-10-01 MED ORDER — SILVER SULFADIAZINE 1 % EX CREA: 1.0000 "application " | TOPICAL_CREAM | Freq: Every day | CUTANEOUS | 0 refills | Status: DC

## 2021-10-01 MED ORDER — METOPROLOL SUCCINATE ER 50 MG PO TB24: ORAL_TABLET | ORAL | 1 refills | Status: AC

## 2021-10-01 MED ORDER — VALACYCLOVIR HCL 500 MG PO TABS: ORAL_TABLET | ORAL | 1 refills | Status: AC

## 2021-10-01 MED ORDER — CLINDAMYCIN PHOSPHATE 1 % EX GEL: Freq: Two times a day (BID) | CUTANEOUS | 0 refills | Status: DC

## 2021-10-01 NOTE — Assessment & Plan Note (Signed)
Continue episodic valtrex

## 2021-10-01 NOTE — Patient Instructions (Addendum)
Ms. Tammi Boulier to see you  Call if things don't get better  I recommend a physical and lab work annually  Thanks,  Denice Paradise     If you have lab work done today you will be contacted with your lab results within the next 2 weeks.  If you have not heard from [place] then please contact us. The fastest way to get your results is to register for My Chart.   IF you received an x-ray today, you will receive an invoice from [facility]. Please contact [facility] at [phone] with questions or concerns regarding your invoice.   IF you received labwork today, you will receive an invoice from [facility]. Please contact [facility] at [phone] with questions or concerns regarding your invoice.   Our billing staff will not be able to assist you with questions regarding bills from these companies.  You will be contacted with the lab results as soon as they are available. The fastest way to get your results is to activate your My Chart account. Instructions are located on the last page of this paperwork. If you have not heard from [place] regarding the results in 2 weeks, please contact this office.

## 2021-10-01 NOTE — Progress Notes (Signed)
Established Patient Office Visit  Subjective:  Patient ID: Kimberly Macdonald, female    DOB: August 25, 1965  Age: 56 y.o. MRN: [id number]  CC:  Chief Complaint  Patient presents with   Medication Refill    Patient states she needs a medication refill. Patient states she is having some face pain due to a reaction to medication doxycycline    HPI Kimberly Macdonald presents for med refill, face pain  Tachycardia Metoprolol '50mg'$  24hr po qd Good effect, no AE. Hopes to continue.   HSV Doing well with episodic treatment No AE Would like to continue.  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 These have worsened the reaction Used old rx of benzaclin which did not help.   Outpatient Medications Prior to Visit  Medication Sig Dispense Refill   b complex vitamins tablet Take 1 tablet by mouth daily.     co-enzyme Q-10 30 MG capsule Take 300 mg by mouth daily.      L-Arginine 1000 MG TABS Take by mouth.      levothyroxine (SYNTHROID, LEVOTHROID) 88 MCG tablet Take 88 mcg by mouth every evening.      Multiple Vitamin (MULTIVITAMIN) tablet Take 1 tablet by mouth daily.     phentermine 37.5 MG capsule Take 37.5 mg by mouth every morning. Taking 1/2 daily     metoprolol succinate (TOPROL-XL) 50 MG 24 hr tablet TAKE 1 TABLET BY MOUTH NIGHTLY WITH OR IMMEDIATELY FOLLOWING A MEAL. 90 tablet 1   valACYclovir (VALTREX) 500 MG tablet TAKE 1 TABLET BY MOUTH TWICE A DAY AS NEEDED FOR OUTBREAKS 180 tablet 1   doxycycline (VIBRA-TABS) 100 MG tablet Take 1 tablet (100 mg total) by mouth 2 (two) times daily. (Patient not taking: Reported on 10/01/2021) 20 tablet 0   No facility-administered medications prior to visit.    Review of Systems  Constitutional: Negative.   HENT: Negative.    Eyes: Negative.   Respiratory: Negative.    Cardiovascular: Negative.    Gastrointestinal: Negative.   Genitourinary: Negative.   Musculoskeletal: Negative.   Skin:  Positive for rash.  Neurological: Negative.   Psychiatric/Behavioral: Negative.    All other systems reviewed and are negative.    Objective:     BP 110/64   Pulse 80   Temp 98 F (36.7 C) (Temporal)   Resp 17   Ht '5\' 2"'$  (1.575 m)   Wt 130 lb 3.2 oz (59.1 kg)   LMP 06/21/2015 (Approximate)   SpO2 98%   BMI 23.81 kg/m   Wt Readings from Last 3 Encounters:  10/01/21 130 lb 3.2 oz (59.1 kg)  05/09/20 135 lb 6.4 oz (61.4 kg)  03/21/20 130 lb 3.2 oz (59.1 kg)   Physical Exam Vitals and nursing note reviewed.  Constitutional:      General: She is not in acute distress.    Appearance: Normal appearance. She is normal weight. She is not ill-appearing, toxic-appearing or diaphoretic.  Cardiovascular:     Rate and Rhythm: Normal rate and regular rhythm.     Heart sounds: Normal heart sounds. No murmur heard.   No friction rub. No gallop.  Pulmonary:     Effort: Pulmonary effort is normal. No respiratory distress.     Breath sounds: Normal breath sounds. No stridor. No wheezing, rhonchi or rales.  Chest:  Chest wall: No tenderness.  Skin:    General: Skin is warm and dry.     Findings: Rash (blistering rash on face.) present.  Neurological:     General: No focal deficit present.     Mental Status: She is alert and oriented to person, place, and time. Mental status is at baseline.  Psychiatric:        Mood and Affect: Mood normal.        Behavior: Behavior normal.        Thought Content: Thought content normal.        Judgment: Judgment normal.    No results found for any visits on 10/01/21.    The 10-year ASCVD risk score (Arnett DK, et al., 2019) is: 1.2%    Assessment & Plan:   Problem List Items Addressed This Visit       Musculoskeletal and Integument   Rash of face    Will give silvadene and clindamycin gel. Use as directed Advised to keep areas clean and  dry Return if worsening or failing to improve.        Relevant Medications   silver sulfADIAZINE (SILVADENE) 1 % cream   clindamycin (CLINDAGEL) 1 % gel     Other   Tachycardia - Primary    Continue metoprolol        Relevant Medications   metoprolol succinate (TOPROL-XL) 50 MG 24 hr tablet   HSV infection    Continue episodic valtrex       Relevant Medications   valACYclovir (VALTREX) 500 MG tablet   Other Visit Diagnoses     Adverse effect of drug, initial encounter       Relevant Medications   silver sulfADIAZINE (SILVADENE) 1 % cream   clindamycin (CLINDAGEL) 1 % gel       Meds ordered this encounter  Medications   metoprolol succinate (TOPROL-XL) 50 MG 24 hr tablet    Sig: Take with or immediately following a meal.    Dispense:  90 tablet    Refill:  1    Order Specific Question:   Supervising Provider    Answer:   Carlota Raspberry, JEFFREY R [[id number]]   valACYclovir (VALTREX) 500 MG tablet    Sig: TAKE 1 TABLET BY MOUTH TWICE A DAY AS NEEDED FOR OUTBREAKS    Dispense:  180 tablet    Refill:  1    Order Specific Question:   Supervising Provider    Answer:   Carlota Raspberry, JEFFREY R [[id number]]   silver sulfADIAZINE (SILVADENE) 1 % cream    Sig: Apply 1 application. topically daily.    Dispense:  50 g    Refill:  0    Order Specific Question:   Supervising Provider    Answer:   Carlota Raspberry, JEFFREY R [[id number]]   clindamycin (CLINDAGEL) 1 % gel    Sig: Apply topically 2 (two) times daily.    Dispense:  30 g    Refill:  0    Order Specific Question:   Supervising Provider    Answer:   Carlota Raspberry, JEFFREY R [[id number]]    Return if symptoms worsen or fail to improve.    Maximiano Coss, NP

## 2021-10-01 NOTE — Assessment & Plan Note (Signed)
Will give silvadene and clindamycin gel. Use as directed Advised to keep areas clean and dry Return if worsening or failing to improve.

## 2021-10-01 NOTE — Assessment & Plan Note (Signed)
Continue metoprolol. 

## 2021-12-01 ENCOUNTER — Other Ambulatory Visit: Payer: Self-pay | Admitting: Registered Nurse

## 2021-12-01 DIAGNOSIS — T50905A Adverse effect of unspecified drugs, medicaments and biological substances, initial encounter: Secondary | ICD-10-CM

## 2021-12-01 DIAGNOSIS — R21 Rash and other nonspecific skin eruption: Secondary | ICD-10-CM

## 2022-03-26 ENCOUNTER — Other Ambulatory Visit: Payer: Self-pay | Admitting: Family Medicine

## 2022-03-26 DIAGNOSIS — G44319 Acute post-traumatic headache, not intractable: Secondary | ICD-10-CM

## 2022-03-26 DIAGNOSIS — S060X0D Concussion without loss of consciousness, subsequent encounter: Secondary | ICD-10-CM

## 2022-04-11 ENCOUNTER — Ambulatory Visit
Admission: RE | Admit: 2022-04-11 | Discharge: 2022-04-11 | Disposition: A | Discharge status: Home / Self Care | Payer: Commercial Managed Care - HMO | Source: Ambulatory Visit | Attending: Family Medicine | Admitting: Family Medicine

## 2022-04-11 DIAGNOSIS — G44319 Acute post-traumatic headache, not intractable: Secondary | ICD-10-CM

## 2022-04-11 DIAGNOSIS — S060X0D Concussion without loss of consciousness, subsequent encounter: Secondary | ICD-10-CM

## 2022-06-30 IMAGING — MR MR HEAD WO/W CM
13 series · 48 of 48 positions shown · IV contrast (multihance)
Comparison: Head MRI 09/06/2014

CLINICAL DATA: Amaurosis fugax. Transient right eye vision loss
occurring 4 weeks ago.

EXAM:
MRI HEAD WITHOUT AND WITH CONTRAST
MRA HEAD WITHOUT CONTRAST
TECHNIQUE: Multiplanar, multiecho pulse sequences of the brain and surrounding
structures were obtained without and with intravenous contrast.
Angiographic images of the head were obtained using MRA technique
without contrast.
CONTRAST:  11mL MULTIHANCE GADOBENATE DIMEGLUMINE 529 MG/ML IV SOLN

[Series 2: T1 · sagittal · 5.0mm · 0.45mm/px · 1 of 21 slices shown]
[im 1/21]
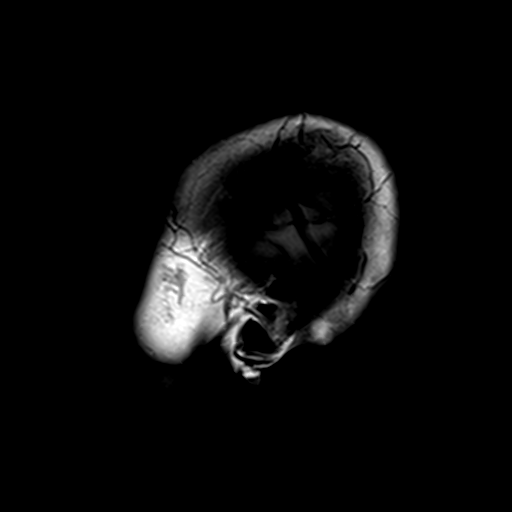

[Series 3: DWI · axial · 3.0mm · 1.80mm/px · z∈[-23,+136]mm · 6 of 107 slices shown (1 of 4)]
[im 1/107]
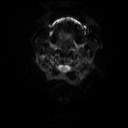
[im 22/107]
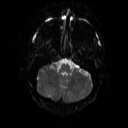
[im 43/107]
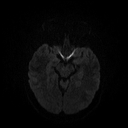
[im 64/107]
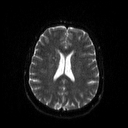
[im 85/107]
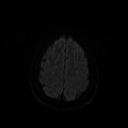
[im 107/107]
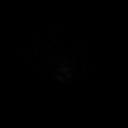

[Series 4: DWI · axial · 3.0mm · 1.80mm/px · z∈[-23,+136]mm · 3 of 50 slices shown (2 of 4)]
[im 1/50]
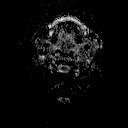
[im 25/50]
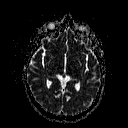
[im 50/50]
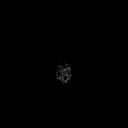

[Series 5: DWI · coronal · 5.0mm · 1.80mm/px · 5 of 73 slices shown (3 of 4)]
[im 1/73]
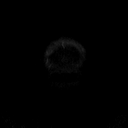
[im 19/73]
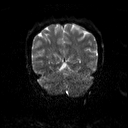
[im 37/73]
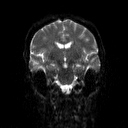
[im 55/73]
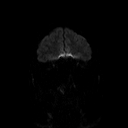
[im 73/73]
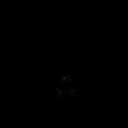

[Series 6: DWI · coronal · 5.0mm · 1.80mm/px · 2 of 37 slices shown (4 of 4)]
[im 1/37]
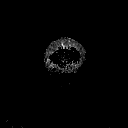
[im 37/37]
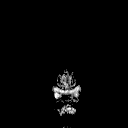

[Series 7: T2 · axial · 5.0mm · 0.60mm/px · z∈[-20,+135]mm · 2 of 24 slices shown (1 of 2)]
[im 1/24]
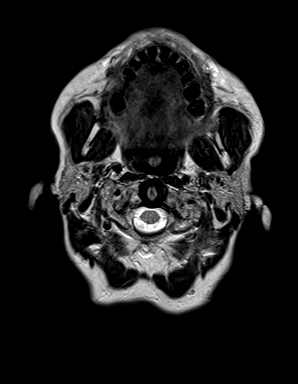
[im 24/24]
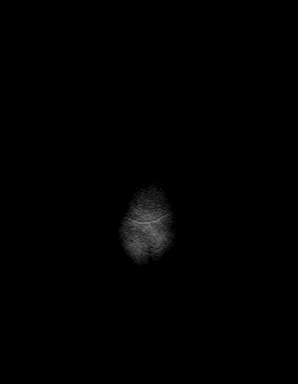

[Series 8: FLAIR · axial · 3.0mm · 0.45mm/px · z∈[-21,+133]mm · 2 of 34 slices shown]
[im 1/34]
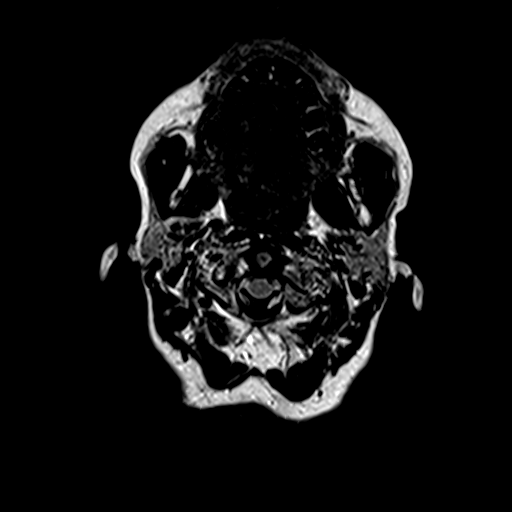
[im 34/34]
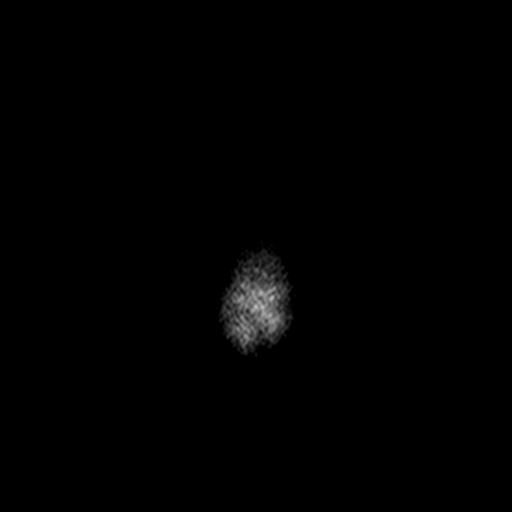

[Series 9: mip_images(sw) · axial · 32.0mm · 0.90mm/px · z∈[-8,+120]mm · 2 of 33 slices shown]
[im 1/33]
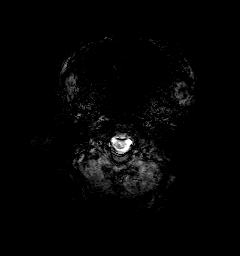
[im 33/33]
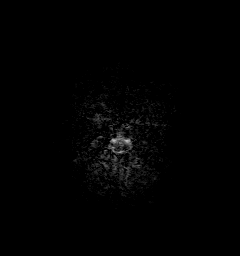

[Series 10: swi_images · axial · 4.0mm · 0.90mm/px · z∈[-22,+134]mm · 3 of 40 slices shown]
[im 1/40]
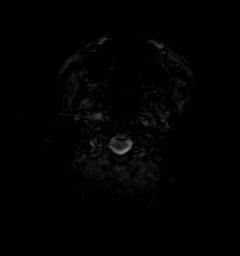
[im 20/40]
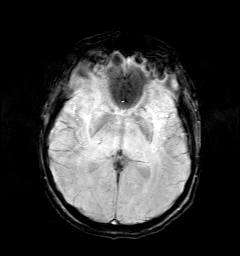
[im 40/40]
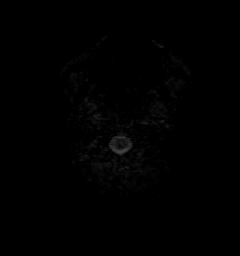

[Series 11: t1_mpr_tra · axial · 1.0mm · 0.75mm/px · z∈[-14,+128]mm · 9 of 144 slices shown (1 of 2)]
[im 1/144]
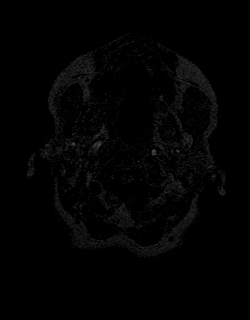
[im 18/144]
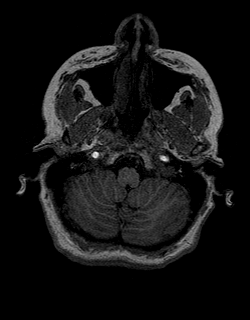
[im 36/144]
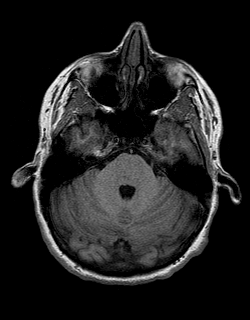
[im 54/144]
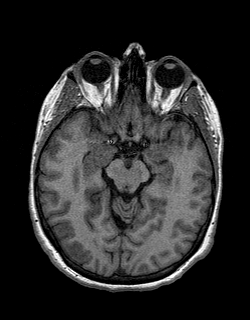
[im 72/144]
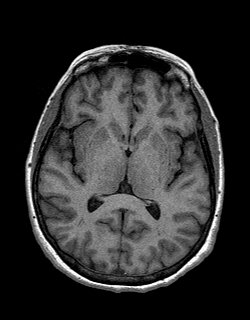
[im 90/144]
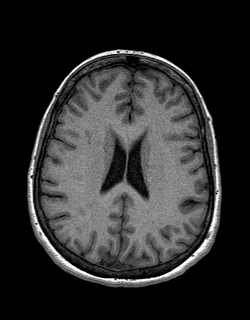
[im 108/144]
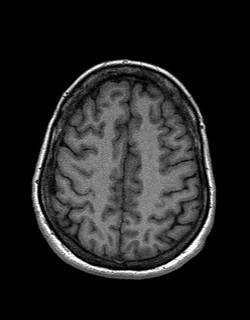
[im 126/144]
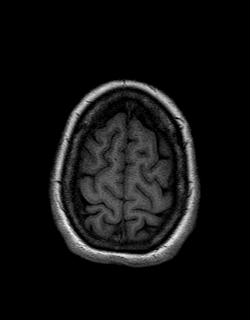
[im 144/144]
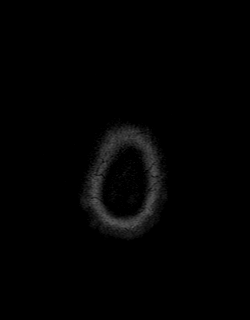

[Series 12: T2 · coronal · 5.0mm · 0.45mm/px · 2 of 25 slices shown (2 of 2)]
[im 1/25]
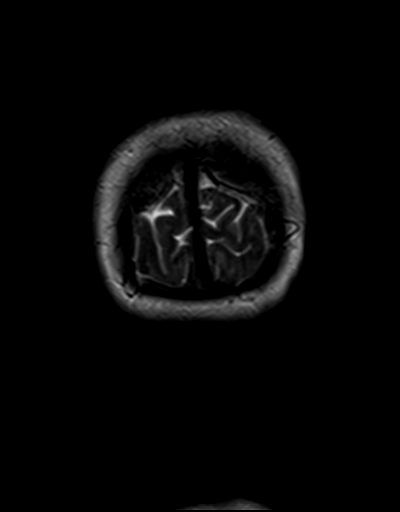
[im 25/25]
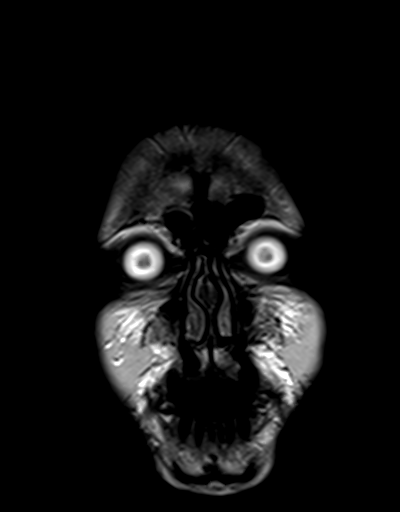

[Series 13: t1_mpr_tra · axial · 1.0mm · 0.75mm/px · z∈[-14,+128]mm · 9 of 144 slices shown (2 of 2)]
[im 1/144]
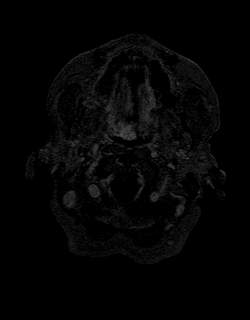
[im 18/144]
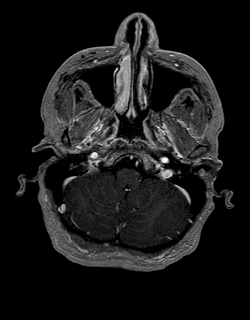
[im 36/144]
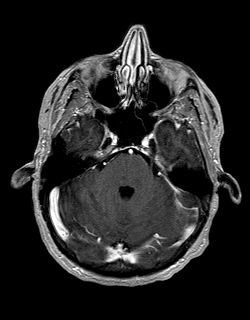
[im 54/144]
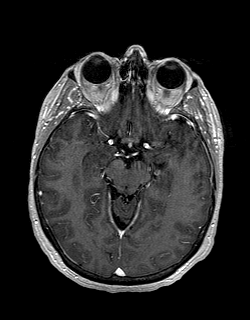
[im 72/144]
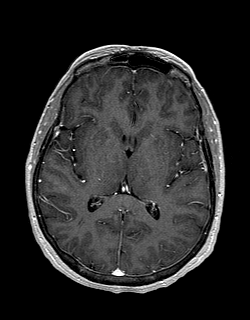
[im 90/144]
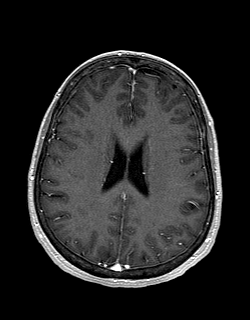
[im 108/144]
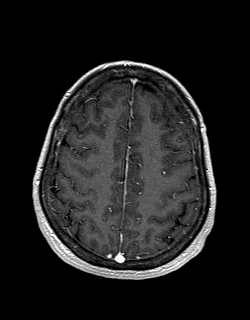
[im 126/144]
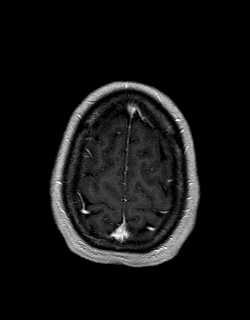
[im 144/144]
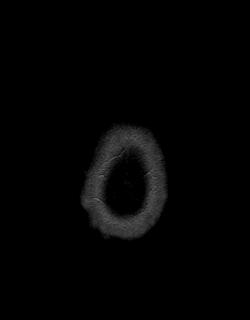

[Series 14: post cor · coronal · 5.0mm · 0.45mm/px · 2 of 25 slices shown]
[im 1/25]
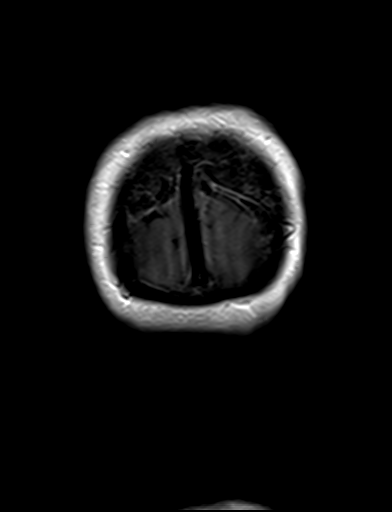
[im 25/25]
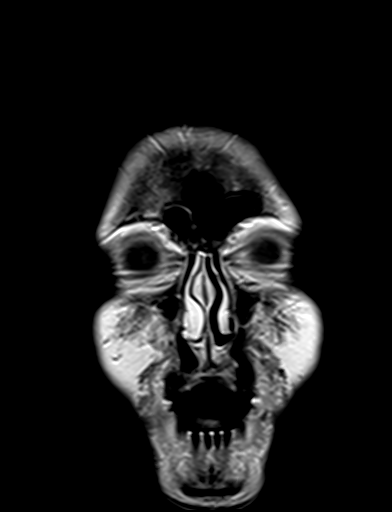

[48 of 48 positions shown; findings below may reference images not displayed]

FINDINGS: MRI HEAD FINDINGS

Brain: There is no evidence of an acute infarct, intracranial
hemorrhage, mass, midline shift, or extra-axial fluid collection.
Small T2 hyperintensities in the cerebral white matter bilaterally
have slightly progressed and are nonspecific but compatible with
mild chronic small vessel ischemic disease. No abnormal enhancement
is identified.

Vascular: Major intracranial vascular flow voids are preserved.

Skull and upper cervical spine: Unremarkable bone marrow signal.

Sinuses/Orbits: Unremarkable orbits. Paranasal sinuses and mastoid
air cells are clear.

Other: None.

MRA HEAD FINDINGS

The visualized distal vertebral arteries are patent to the basilar
and codominant. Symmetric signal loss in the distal V3 and proximal
V4 segments bilaterally is likely artifactual. Patent PICAs and SCAs
are seen bilaterally. The basilar artery is widely patent with a
small fenestration incidentally noted proximally. There is a medium
sized right posterior communicating artery. Both PCAs are patent
without evidence of a significant proximal stenosis.

The internal carotid arteries are widely patent from skull base to
carotid termini. ACAs and MCAs are patent without evidence of a
proximal branch occlusion or significant proximal stenosis. No
aneurysm is identified.
IMPRESSION: 1. No acute intracranial abnormality.
2. Mild chronic small vessel ischemic disease, slightly progressed
3. Negative head MRA.

## 2022-08-18 ENCOUNTER — Ambulatory Visit: Payer: Self-pay | Admitting: General Surgery

## 2022-08-18 NOTE — H&P (Signed)
PROVIDER:  Elenora Gamma, MD   MRN: [id number] DOB: [birth year] DATE OF ENCOUNTER: 08/18/2022 Subjective    Chief Complaint: No chief complaint on file.   History of Present Illness: Kimberly Macdonald is a 57 y.o. female who is seen today for AIN. 57year-old female who presents to the office for yearly follow-up.  She underwent a colonoscopy in 2020 due to some rectal bleeding with wiping.  This showed an ascending colon polyp and a polypoid lesion in the distal rectum.  Pathology of the polypoid lesion showed AIN grade 1.  She was taken to the operating room in January 2021 for high resolution anoscopy.  The left lateral and posterior midline anal canal lesions were removed.  Pathology showed AIN grade 2.  She underwent follow-up surveillance in January 2022.  She was noted to have scar in the left lateral and middle anal canal.  There were no active lesions noted except for a small plaque on the right anterior hemorrhoid.  She does complain of some recurrent rectal bleeding.    Past Medical History:  Diagnosis Date   Acne    Anal intraepithelial neoplasia I    Cataract    right eye, getting a second opinion   Cervical dysplasia    Depression    Diverticulosis    GERD (gastroesophageal reflux disease)    with pregnancy   Heart palpitations    History of   History of colon polyps    History of iron deficiency anemia    as teenager   History of rectal bleeding    History of shingles    History of vertigo    Hypothyroidism    Internal hemorrhoids    Interstitial cystitis    resolved   Low blood pressure    PONV (postoperative nausea and vomiting)    Poor circulation of extremity    bilateral lower   Sinus tachycardia    History of   Thyroid disease    Phreesia 05/06/2020   Wears glasses    Past Surgical History:  Procedure Laterality Date   APPENDECTOMY  1994   BREAST ENHANCEMENT SURGERY     Cardiometabolic Testing  02/2012   normal effort with peak VO2 of  73% predicted, HR accelerated at 97% predcited, HR on VO2 curve showed steep invline at anaerobic threshold - low risk, abnormal   CERVICAL CONIZATION W/BX     CHOLECYSTECTOMY     CYSTOSCOPY W/ DILATION OF BLADDER     DILATION AND EVACUATION     miscarriage x3   GYNECOLOGIC CRYOSURGERY     HIGH RESOLUTION ANOSCOPY N/A 05/12/2019   Procedure: HIGH RESOLUTION ANOSCOPY, ANAL BIOPSY;  Surgeon: Romie Levee, MD;  Location: [facility];  Service: General;  Laterality: N/A;   SHOULDER SURGERY Left    Muscle detachment   TONSILLECTOMY     TRANSTHORACIC ECHOCARDIOGRAM  03/2005   EF 55-65%, normal LV systolic function - no LV regional wall motion abnormalities   VAGINAL WOUND CLOSURE / REPAIR     after vaginal delivery   WISDOM TOOTH EXTRACTION     Family History  Problem Relation Age of Onset   Hypertension Mother    Diverticulitis Mother        part of colon removed   Colon polyps Mother    Hyperlipidemia Father    Hypertension Father    Depression Sister    Breast cancer Maternal Grandmother    Heart disease Paternal Grandmother  MI    Heart disease Paternal Grandfather        MI   Colon cancer Neg Hx    Esophageal cancer Neg Hx    Rectal cancer Neg Hx    Social History   Socioeconomic History   Marital status: Married    Spouse name: Not on file   Number of children: 3   Years of education: 66yr coll.   Highest education level: Not on file  Occupational History   Occupation: singer/actor  Tobacco Use   Smoking status: Never   Smokeless tobacco: Never  Vaping Use   Vaping Use: Never used  Substance and Sexual Activity   Alcohol use: Yes    Comment: occasionally (twice yearly)   Drug use: No   Sexual activity: Not Currently    Partners: Male    Birth control/protection: Post-menopausal  Other Topics Concern   Not on file  Social History Narrative   Right handed   Social Determinants of Health   Financial Resource Strain: Not on file  Food  Insecurity: Not on file  Transportation Needs: Not on file  Physical Activity: Not on file  Stress: Not on file  Social Connections: Not on file  Intimate Partner Violence: Not on file    Current Outpatient Medications:    b complex vitamins tablet, Take 1 tablet by mouth daily., Disp: , Rfl:    clindamycin (CLINDAGEL) 1 % gel, APPLY TOPICALLY TWICE A DAY, Disp: 30 g, Rfl: 0   co-enzyme Q-10 30 MG capsule, Take 300 mg by mouth daily. , Disp: , Rfl:    L-Arginine 1000 MG TABS, Take by mouth. , Disp: , Rfl:    levothyroxine (SYNTHROID, LEVOTHROID) 88 MCG tablet, Take 88 mcg by mouth every evening. , Disp: , Rfl:    metoprolol succinate (TOPROL-XL) 50 MG 24 hr tablet, Take with or immediately following a meal., Disp: 90 tablet, Rfl: 1   Multiple Vitamin (MULTIVITAMIN) tablet, Take 1 tablet by mouth daily., Disp: , Rfl:    phentermine 37.5 MG capsule, Take 37.5 mg by mouth every morning. Taking 1/2 daily, Disp: , Rfl:    silver sulfADIAZINE (SILVADENE) 1 % cream, Apply 1 application. topically daily., Disp: 50 g, Rfl: 0   valACYclovir (VALTREX) 500 MG tablet, TAKE 1 TABLET BY MOUTH TWICE A DAY AS NEEDED FOR OUTBREAKS, Disp: 180 tablet, Rfl: 1 Allergies  Allergen Reactions   Azo [Phenazopyridine Hcl] Hives   Flagyl [Metronidazole] Hives   Other Hives, Itching, Rash and Swelling   Iodine Hives, Itching, Rash and Swelling   Nitrofurantoin Monohyd Macro Rash    Flu like sxs   Lodine [Etodolac]     "Advil is ok"   Macrolides And Ketolides    Review of Systems - Negative except as stated above     Objective:         Vitals:    08/18/22 1132  Pulse: (!) 114  SpO2: 98%  Weight: 60.9 kg (134 lb 3.2 oz)  PainSc: 0-No pain        General appearance - alert, well appearing, and in no distress CV RRR Lungs: CTA Abd: soft Rectal - normal rectal, no masses     Labs, Imaging and Diagnostic Testing:     Procedure: Anoscopy  Surgeon: Maisie Fus After the risks and benefits were  explained, written consent was obtained for above procedure.  A medical assistant chaperone was present thoroughout the entire procedure.  Anesthesia: none Diagnosis:  Findings: Inflamed grade 2 right anterior  right posterior hemorrhoids, right anterior greater than right posterior.  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 Recommend HRA with excisional biopsy of L lateral lesion. Grade II hemorrhoids Failed band ligation with recurrent rectal bleeding.  I recommend hemorrhoidectomy.  Risks include bleeding, pain and recurrence.           Vanita Panda, MD Colon and Rectal Surgery [facility]

## 2022-09-16 ENCOUNTER — Encounter (HOSPITAL_BASED_OUTPATIENT_CLINIC_OR_DEPARTMENT_OTHER): Payer: Self-pay | Admitting: General Surgery

## 2022-09-16 NOTE — Progress Notes (Addendum)
Spoke w/ via phone for pre-op interview--- Kimberly Macdonald needs dos----   EKG per anesthesia            Macdonald results------ COVID test -----patient states asymptomatic no test needed Arrive at -------0630 NPO after MN NO Solid Food.  Med rec completed Medications to take morning of surgery -----Synthroid Diabetic medication ----- Patient instructed no nail polish to be worn day of surgery Patient instructed to bring photo id and insurance card day of surgery Patient aware to have Driver (ride ) / caregiver   Husband Kimberly Macdonald for 24 hours after surgery  Patient Special Instructions ----- Patient takes Phentermine, instructed to hold medication 7 days prior to surgery, patient verbalized understanding. Pre-Op special Instructions ----- Patient verbalized understanding of instructions that were given at this phone interview. Patient denies shortness of breath, chest pain, fever, cough at this phone interview.

## 2022-10-01 ENCOUNTER — Other Ambulatory Visit: Payer: Self-pay

## 2022-10-01 ENCOUNTER — Ambulatory Visit (HOSPITAL_BASED_OUTPATIENT_CLINIC_OR_DEPARTMENT_OTHER): Payer: PRIVATE HEALTH INSURANCE | Admitting: Certified Registered Nurse Anesthetist

## 2022-10-01 ENCOUNTER — Encounter (HOSPITAL_BASED_OUTPATIENT_CLINIC_OR_DEPARTMENT_OTHER)
Admission: RE | Disposition: A | Discharge status: Home / Self Care | Payer: Self-pay | Source: Home / Self Care | Attending: General Surgery

## 2022-10-01 ENCOUNTER — Ambulatory Visit (HOSPITAL_BASED_OUTPATIENT_CLINIC_OR_DEPARTMENT_OTHER)
Admission: RE | Admit: 2022-10-01 | Discharge: 2022-10-01 | Disposition: A | Discharge status: Home / Self Care | Payer: PRIVATE HEALTH INSURANCE | Attending: General Surgery | Admitting: General Surgery

## 2022-10-01 ENCOUNTER — Encounter (HOSPITAL_BASED_OUTPATIENT_CLINIC_OR_DEPARTMENT_OTHER): Payer: Self-pay | Admitting: General Surgery

## 2022-10-01 DIAGNOSIS — K641 Second degree hemorrhoids: Secondary | ICD-10-CM | POA: Insufficient documentation

## 2022-10-01 DIAGNOSIS — E039 Hypothyroidism, unspecified: Secondary | ICD-10-CM | POA: Diagnosis not present

## 2022-10-01 DIAGNOSIS — K629 Disease of anus and rectum, unspecified: Secondary | ICD-10-CM

## 2022-10-01 DIAGNOSIS — A630 Anogenital (venereal) warts: Secondary | ICD-10-CM | POA: Insufficient documentation

## 2022-10-01 DIAGNOSIS — Z79899 Other long term (current) drug therapy: Secondary | ICD-10-CM | POA: Diagnosis not present

## 2022-10-01 DIAGNOSIS — K625 Hemorrhage of anus and rectum: Secondary | ICD-10-CM | POA: Insufficient documentation

## 2022-10-01 DIAGNOSIS — K6282 Dysplasia of anus: Secondary | ICD-10-CM | POA: Diagnosis not present

## 2022-10-01 DIAGNOSIS — I10 Essential (primary) hypertension: Secondary | ICD-10-CM | POA: Diagnosis not present

## 2022-10-01 HISTORY — PX: HEMORRHOID SURGERY: SHX153

## 2022-10-01 HISTORY — DX: Tachycardia, unspecified: R00.0

## 2022-10-01 HISTORY — DX: Pain in arm, unspecified: M79.603

## 2022-10-01 HISTORY — PX: HIGH RESOLUTION ANOSCOPY: SHX6345

## 2022-10-01 SURGERY — ANOSCOPY, HIGH RESOLUTION
Anesthesia: Monitor Anesthesia Care | Site: Rectum

## 2022-10-01 MED ORDER — ACETAMINOPHEN 500 MG PO TABS
1000.0000 mg | ORAL_TABLET | ORAL | Status: AC
  Administered 2022-10-01: 1000 mg via ORAL

## 2022-10-01 MED ORDER — GABAPENTIN 300 MG PO CAPS
ORAL_CAPSULE | ORAL | Status: AC
  Filled 2022-10-01: qty 1

## 2022-10-01 MED ORDER — ACETAMINOPHEN 325 MG RE SUPP: 650.0000 mg | RECTAL | Status: DC | PRN

## 2022-10-01 MED ORDER — OXYCODONE HCL 5 MG/5ML PO SOLN: 5.0000 mg | Freq: Once | ORAL | Status: DC | PRN

## 2022-10-01 MED ORDER — OXYCODONE HCL 5 MG PO TABS: 5.0000 mg | ORAL_TABLET | Freq: Once | ORAL | Status: DC | PRN

## 2022-10-01 MED ORDER — BUPIVACAINE LIPOSOME 1.3 % IJ SUSP: INTRAMUSCULAR | Status: DC | PRN

## 2022-10-01 MED ORDER — PROPOFOL 10 MG/ML IV BOLUS
INTRAVENOUS | Status: DC | PRN
  Administered 2022-10-01: 20 mg via INTRAVENOUS

## 2022-10-01 MED ORDER — FENTANYL CITRATE (PF) 100 MCG/2ML IJ SOLN: 25.0000 ug | INTRAMUSCULAR | Status: DC | PRN

## 2022-10-01 MED ORDER — PROPOFOL 10 MG/ML IV BOLUS
INTRAVENOUS | Status: AC
  Filled 2022-10-01: qty 20

## 2022-10-01 MED ORDER — PHENYLEPHRINE 80 MCG/ML (10ML) SYRINGE FOR IV PUSH (FOR BLOOD PRESSURE SUPPORT)
PREFILLED_SYRINGE | INTRAVENOUS | Status: DC | PRN
  Administered 2022-10-01 (×2): 80 ug via INTRAVENOUS

## 2022-10-01 MED ORDER — SODIUM CHLORIDE 0.9% FLUSH: 3.0000 mL | Freq: Two times a day (BID) | INTRAVENOUS | Status: DC

## 2022-10-01 MED ORDER — MIDAZOLAM HCL 2 MG/2ML IJ SOLN
INTRAMUSCULAR | Status: DC | PRN
  Administered 2022-10-01: 2 mg via INTRAVENOUS

## 2022-10-01 MED ORDER — GLYCOPYRROLATE 0.2 MG/ML IJ SOLN
INTRAMUSCULAR | Status: DC | PRN
  Administered 2022-10-01: .1 mg via INTRAVENOUS

## 2022-10-01 MED ORDER — TRAMADOL HCL 50 MG PO TABS: 50.0000 mg | ORAL_TABLET | Freq: Four times a day (QID) | ORAL | 0 refills | Status: AC | PRN

## 2022-10-01 MED ORDER — ONDANSETRON HCL 4 MG/2ML IJ SOLN: 4.0000 mg | Freq: Once | INTRAMUSCULAR | Status: DC | PRN

## 2022-10-01 MED ORDER — BUPIVACAINE-EPINEPHRINE (PF) 0.25% -1:200000 IJ SOLN
INTRAMUSCULAR | Status: DC | PRN
  Administered 2022-10-01: 50 mL via SURGICAL_CAVITY

## 2022-10-01 MED ORDER — BUPIVACAINE LIPOSOME 1.3 % IJ SUSP: 20.0000 mL | Freq: Once | INTRAMUSCULAR | Status: DC

## 2022-10-01 MED ORDER — ACETAMINOPHEN 500 MG PO TABS
ORAL_TABLET | ORAL | Status: AC
  Filled 2022-10-01: qty 2

## 2022-10-01 MED ORDER — GABAPENTIN 300 MG PO CAPS
300.0000 mg | ORAL_CAPSULE | ORAL | Status: AC
  Administered 2022-10-01: 300 mg via ORAL

## 2022-10-01 MED ORDER — PROPOFOL 500 MG/50ML IV EMUL
INTRAVENOUS | Status: DC | PRN
  Administered 2022-10-01: 150 ug/kg/min via INTRAVENOUS

## 2022-10-01 MED ORDER — KETOROLAC TROMETHAMINE 30 MG/ML IJ SOLN: 30.0000 mg | Freq: Once | INTRAMUSCULAR | Status: DC | PRN

## 2022-10-01 MED ORDER — MIDAZOLAM HCL 2 MG/2ML IJ SOLN
INTRAMUSCULAR | Status: AC
  Filled 2022-10-01: qty 2

## 2022-10-01 MED ORDER — LIDOCAINE 2% (20 MG/ML) 5 ML SYRINGE
INTRAMUSCULAR | Status: DC | PRN
  Administered 2022-10-01: 40 mg via INTRAVENOUS

## 2022-10-01 MED ORDER — FENTANYL CITRATE (PF) 100 MCG/2ML IJ SOLN
INTRAMUSCULAR | Status: AC
  Filled 2022-10-01: qty 2

## 2022-10-01 MED ORDER — LACTATED RINGERS IV SOLN: INTRAVENOUS | Status: DC

## 2022-10-01 MED ORDER — ACETAMINOPHEN 325 MG PO TABS: 650.0000 mg | ORAL_TABLET | ORAL | Status: DC | PRN

## 2022-10-01 MED ORDER — OXYCODONE HCL 5 MG PO TABS: 5.0000 mg | ORAL_TABLET | ORAL | Status: DC | PRN

## 2022-10-01 MED ORDER — DEXMEDETOMIDINE HCL IN NACL 80 MCG/20ML IV SOLN
INTRAVENOUS | Status: DC | PRN
  Administered 2022-10-01: 8 ug via INTRAVENOUS

## 2022-10-01 MED ORDER — ONDANSETRON HCL 4 MG/2ML IJ SOLN
INTRAMUSCULAR | Status: DC | PRN
  Administered 2022-10-01: 4 mg via INTRAVENOUS

## 2022-10-01 MED ORDER — ACETIC ACID 5 % SOLN
Status: DC | PRN
  Administered 2022-10-01: 1 via TOPICAL

## 2022-10-01 MED ORDER — FENTANYL CITRATE (PF) 250 MCG/5ML IJ SOLN
INTRAMUSCULAR | Status: DC | PRN
  Administered 2022-10-01: 25 ug via INTRAVENOUS
  Administered 2022-10-01: 50 ug via INTRAVENOUS
  Administered 2022-10-01: 25 ug via INTRAVENOUS

## 2022-10-01 MED ORDER — SODIUM CHLORIDE 0.9 % IV SOLN: 250.0000 mL | INTRAVENOUS | Status: DC | PRN

## 2022-10-01 MED ORDER — SODIUM CHLORIDE 0.9% FLUSH: 3.0000 mL | INTRAVENOUS | Status: DC | PRN

## 2022-10-01 SURGICAL SUPPLY — 47 items
APL SKNCLS STERI-STRIP NONHPOA (GAUZE/BANDAGES/DRESSINGS) ×1
APL SWBSTK 6 STRL LF DISP (MISCELLANEOUS)
APPLICATOR COTTON TIP 6 STRL (MISCELLANEOUS) IMPLANT
APPLICATOR COTTON TIP 6IN STRL (MISCELLANEOUS)
BENZOIN TINCTURE PRP APPL 2/3 (GAUZE/BANDAGES/DRESSINGS) ×1 IMPLANT
BLADE EXTENDED COATED 6.5IN (ELECTRODE) IMPLANT
BLADE SURG 10 STRL SS (BLADE) ×1 IMPLANT
BRIEF MESH DISP LRG (UNDERPADS AND DIAPERS) ×1 IMPLANT
COVER BACK TABLE 60X90IN (DRAPES) ×1 IMPLANT
COVER MAYO STAND STRL (DRAPES) ×1 IMPLANT
DRAPE HYSTEROSCOPY (MISCELLANEOUS) IMPLANT
DRAPE LAPAROTOMY 100X72 PEDS (DRAPES) ×1 IMPLANT
DRAPE SHEET LG 3/4 BI-LAMINATE (DRAPES) IMPLANT
DRAPE UTILITY XL STRL (DRAPES) ×1 IMPLANT
ELECT REM PT RETURN 9FT ADLT (ELECTROSURGICAL) ×1
ELECTRODE REM PT RTRN 9FT ADLT (ELECTROSURGICAL) ×1 IMPLANT
GAUZE 4X4 16PLY ~~LOC~~+RFID DBL (SPONGE) ×1 IMPLANT
GAUZE PAD ABD 8X10 STRL (GAUZE/BANDAGES/DRESSINGS) ×1 IMPLANT
GAUZE SPONGE 4X4 12PLY STRL (GAUZE/BANDAGES/DRESSINGS) IMPLANT
GLOVE BIO SURGEON STRL SZ 6.5 (GLOVE) ×1 IMPLANT
GLOVE INDICATOR 6.5 STRL GRN (GLOVE) ×1 IMPLANT
KIT SIGMOIDOSCOPE (SET/KITS/TRAYS/PACK) IMPLANT
KIT TURNOVER CYSTO (KITS) ×1 IMPLANT
LEGGING LITHOTOMY PAIR STRL (DRAPES) IMPLANT
NDL HYPO 22X1.5 SAFETY MO (MISCELLANEOUS) ×1 IMPLANT
NEEDLE HYPO 22X1.5 SAFETY MO (MISCELLANEOUS) ×1 IMPLANT
NS IRRIG 500ML POUR BTL (IV SOLUTION) ×1 IMPLANT
PACK BASIN DAY SURGERY FS (CUSTOM PROCEDURE TRAY) ×1 IMPLANT
PAD ARMBOARD 7.5X6 YLW CONV (MISCELLANEOUS) IMPLANT
PENCIL SMOKE EVACUATOR (MISCELLANEOUS) ×1 IMPLANT
SLEEVE SCD COMPRESS KNEE MED (STOCKING) ×1 IMPLANT
SPIKE FLUID TRANSFER (MISCELLANEOUS) ×1 IMPLANT
SPONGE HEMORRHOID 8X3CM (HEMOSTASIS) IMPLANT
SPONGE SURGIFOAM ABS GEL 100 (HEMOSTASIS) IMPLANT
SPONGE SURGIFOAM ABS GEL 12-7 (HEMOSTASIS) IMPLANT
SUT CHROMIC 2 0 SH (SUTURE) ×2 IMPLANT
SUT CHROMIC 3 0 SH 27 (SUTURE) ×2 IMPLANT
SUT VIC AB 2-0 SH 27 (SUTURE)
SUT VIC AB 2-0 SH 27XBRD (SUTURE) IMPLANT
SUT VIC AB 4-0 SH 18 (SUTURE) IMPLANT
SYR BULB IRRIG 60ML STRL (SYRINGE) ×1 IMPLANT
SYR CONTROL 10ML LL (SYRINGE) ×1 IMPLANT
TOWEL OR 17X24 6PK STRL BLUE (TOWEL DISPOSABLE) ×1 IMPLANT
TRAY DSU PREP LF (CUSTOM PROCEDURE TRAY) ×1 IMPLANT
TUBE CONNECTING 12X1/4 (SUCTIONS) ×1 IMPLANT
WATER STERILE IRR 500ML POUR (IV SOLUTION) IMPLANT
YANKAUER SUCT BULB TIP NO VENT (SUCTIONS) ×1 IMPLANT

## 2022-10-01 NOTE — Transfer of Care (Signed)
Immediate Anesthesia Transfer of Care Note  Patient: Kimberly Macdonald  Procedure(s) Performed: HIGH RESOLUTION ANOSCOPY WITH BIOPSY (Rectum) HEMORRHOIDECTOMY (Rectum)  Patient Location: PACU  Anesthesia Type:MAC  Level of Consciousness: sedated  Airway & Oxygen Therapy: Patient Spontanous Breathing  Post-op Assessment: Report given to RN and Post -op Vital signs reviewed and stable  Post vital signs: Reviewed and stable  Last Vitals:  Vitals Value Taken Time  BP    Temp    Pulse 68 10/01/22 0918  Resp    SpO2 99 % 10/01/22 0918  Vitals shown include unvalidated device data.  Last Pain:  Vitals:   10/01/22 0711  TempSrc: Oral  PainSc: 0-No pain      Patients Stated Pain Goal: 7 (10/01/22 0711)  Complications: No notable events documented.

## 2022-10-01 NOTE — H&P (Signed)
PROVIDER:  Elenora Gamma, MD   MRN: [id number] DOB: 1966-03-03 DATE OF ENCOUNTER: 08/18/2022 Subjective    Chief Complaint: No chief complaint on file.   History of Present Illness: Alyshia Barszcz is a 57 y.o. female who is seen today for AIN. 57year-old female who presents to the office for yearly follow-up.  She underwent a colonoscopy in 2020 due to some rectal bleeding with wiping.  This showed an ascending colon polyp and a polypoid lesion in the distal rectum.  Pathology of the polypoid lesion showed AIN grade 1.  She was taken to the operating room in January 2021 for high resolution anoscopy.  The left lateral and posterior midline anal canal lesions were removed.  Pathology showed AIN grade 2.  She underwent follow-up surveillance in January 2022.  She was noted to have scar in the left lateral and middle anal canal.  There were no active lesions noted except for a small plaque on the right anterior hemorrhoid.  She does complain of some recurrent rectal bleeding.         Past Medical History:  Diagnosis Date   Acne     Anal intraepithelial neoplasia I     Cataract      right eye, getting a second opinion   Cervical dysplasia     Depression     Diverticulosis     GERD (gastroesophageal reflux disease)      with pregnancy   Heart palpitations      History of   History of colon polyps     History of iron deficiency anemia      as teenager   History of rectal bleeding     History of shingles     History of vertigo     Hypothyroidism     Internal hemorrhoids     Interstitial cystitis      resolved   Low blood pressure     PONV (postoperative nausea and vomiting)     Poor circulation of extremity      bilateral lower   Sinus tachycardia      History of   Thyroid disease      [employer]reesia 05/06/2020   Wears glasses           Past Surgical History:  Procedure Laterality Date   APPENDECTOMY   1994   BREAST ENHANCEMENT SURGERY       Cardiometabolic Testing    02/2012    normal effort with peak VO2 of 73% predicted, HR accelerated at 97% predcited, HR on VO2 curve showed steep invline at anaerobic threshold - low risk, abnormal   CERVICAL CONIZATION W/BX       CHOLECYSTECTOMY       CYSTOSCOPY W/ DILATION OF BLADDER       DILATION AND EVACUATION        miscarriage x3   GYNECOLOGIC CRYOSURGERY       HIGH RESOLUTION ANOSCOPY N/A 05/12/2019    Procedure: HIGH RESOLUTION ANOSCOPY, ANAL BIOPSY;  Surgeon: Romie Levee, MD;  Location: [facility];  Service: General;  Laterality: N/A;   SHOULDER SURGERY Left      Muscle detachment   TONSILLECTOMY       TRANSTHORACIC ECHOCARDIOGRAM   03/2005    EF 55-65%, normal LV systolic function - no LV regional wall motion abnormalities   VAGINAL WOUND CLOSURE / REPAIR        after vaginal delivery   WISDOM TOOTH EXTRACTION  Family History  Problem Relation Age of Onset   Hypertension Mother     Diverticulitis Mother          part of colon removed   Colon polyps Mother     Hyperlipidemia Father     Hypertension Father     Depression Sister     Breast cancer Maternal Grandmother     Heart disease Paternal Grandmother          MI    Heart disease Paternal Grandfather          MI   Colon cancer Neg Hx     Esophageal cancer Neg Hx     Rectal cancer Neg Hx      Social History         Socioeconomic History   Marital status: Married      Spouse name: Not on file   Number of children: 3   Years of education: 59yr coll.   Highest education level: Not on file  Occupational History   Occupation: singer/actor  Tobacco Use   Smoking status: Never   Smokeless tobacco: Never  Vaping Use   Vaping Use: Never used  Substance and Sexual Activity   Alcohol use: Yes      Comment: occasionally (twice yearly)   Drug use: No   Sexual activity: Not Currently      Partners: Male      Birth control/protection: Post-menopausal  Other Topics Concern   Not on file  Social History  Narrative    Right handed    Social Determinants of Health    Financial Resource Strain: Not on file  Food Insecurity: Not on file  Transportation Needs: Not on file  Physical Activity: Not on file  Stress: Not on file  Social Connections: Not on file  Intimate Partner Violence: Not on file      Current Outpatient Medications:    b complex vitamins tablet, Take 1 tablet by mouth daily., Disp: , Rfl:    clindamycin (CLINDAGEL) 1 % gel, APPLY TOPICALLY TWICE A DAY, Disp: 30 g, Rfl: 0   co-enzyme Q-10 30 MG capsule, Take 300 mg by mouth daily. , Disp: , Rfl:    L-Arginine 1000 MG TABS, Take by mouth. , Disp: , Rfl:    levothyroxine (SYNTHROID, LEVOTHROID) 88 MCG tablet, Take 88 mcg by mouth every evening. , Disp: , Rfl:    metoprolol succinate (TOPROL-XL) 50 MG 24 hr tablet, Take with or immediately following a meal., Disp: 90 tablet, Rfl: 1   Multiple Vitamin (MULTIVITAMIN) tablet, Take 1 tablet by mouth daily., Disp: , Rfl:    phentermine 37.5 MG capsule, Take 37.5 mg by mouth every morning. Taking 1/2 daily, Disp: , Rfl:    silver sulfADIAZINE (SILVADENE) 1 % cream, Apply 1 application. topically daily., Disp: 50 g, Rfl: 0   valACYclovir (VALTREX) 500 MG tablet, TAKE 1 TABLET BY MOUTH TWICE A DAY AS NEEDED FOR OUTBREAKS, Disp: 180 tablet, Rfl: 1      Allergies  Allergen Reactions   Azo [Phenazopyridine Hcl] Hives   Flagyl [Metronidazole] Hives   Other Hives, Itching, Rash and Swelling   Iodine Hives, Itching, Rash and Swelling   Nitrofurantoin Monohyd Macro Rash      Flu like sxs   Lodine [Etodolac]        "Advil is ok"   Macrolides And Ketolides      Review of Systems - Negative except as stated above     Objective:  BP 138/72   Pulse 67   Temp 97.8 F (36.6 C) (Oral)   Resp 18   Ht 5\' 2"  (1.575 m)   Wt 62.4 kg   LMP 06/21/2015 (Approximate)   SpO2 100%   BMI 25.15 kg/m       General appearance - alert, well appearing, and in no distress CV RRR Lungs:  CTA Abd: soft Rectal - normal rectal, no masses     Labs, Imaging and Diagnostic Testing:     Procedure: Anoscopy  Surgeon: Maisie Fus After the risks and benefits were explained, written consent was obtained for above procedure.  A medical assistant chaperone was present thoroughout the entire procedure.  Anesthesia: none Diagnosis:  Findings: Inflamed grade 2 right anterior right posterior hemorrhoids, right anterior greater than right posterior.  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 Recommend HRA with excisional biopsy of L lateral lesion. Grade II hemorrhoids Failed band ligation with recurrent rectal bleeding.  I recommend hemorrhoidectomy.  Risks include bleeding, pain and recurrence.           Vanita Panda, MD Colon and Rectal Surgery [facility]

## 2022-10-01 NOTE — Op Note (Signed)
10/01/2022  9:11 AM  PATIENT:  Kimberly Macdonald  57 y.o. female  Patient Care Team: Stevphen Rochester, MD as PCP - General (Family Medicine) Olivia Mackie, MD as Consulting Physician (Obstetrics and Gynecology)  PRE-OPERATIVE DIAGNOSIS:  GRADE 2 HEMORRHOIDS ANAL LESION  POST-OPERATIVE DIAGNOSIS:  GRADE 2 RA HEMORRHOID, ANAL LESION  PROCEDURE: HEMORRHOIDECTOMY HIGH RESOLUTION ANOSCOPY WITH BIOPSY     Surgeon(s): Romie Levee, MD  ASSISTANT: none   ANESTHESIA:   local and MAC  SPECIMEN:  Source of Specimen:  R anterior hemorrhoid, R lateral anal canal mass, Anterior midline distal rectal mucosa  DISPOSITION OF SPECIMEN:  PATHOLOGY  COUNTS:  YES  PLAN OF CARE: Discharge to home after PACU  PATIENT DISPOSITION:  PACU - hemodynamically stable.  INDICATION: 57 y.o. F with rectal bleeding not resolved with hemorrhoid banding and lateral anal canal lesion seen on anoscopy.   OR FINDINGS: R lateral anal canal mass, diffuse acetowhite staining distal rectal mucosa and dentate line at anterior midline, grade 2 RA hemorrhoid  DESCRIPTION: the patient was identified in the preoperative holding area and taken to the OR where they were laid on the operating room table.  MAC anesthesia was induced without difficulty. The patient was then positioned in prone jackknife position with buttocks gently taped apart.  The patient was then prepped and draped in usual sterile fashion.  SCDs were noted to be in place prior to the initiation of anesthesia. A surgical timeout was performed indicating the correct patient, procedure, positioning and need for preoperative antibiotics.  A rectal block was performed using Marcaine with epinephrine and Experel.    I began with a digital rectal exam.  There were no palpable lesions.   Anal canal was dilated to approximately 2 fingerbreadths 11 mm posteriorly.  I then placed a Hill-Ferguson anoscope into the anal canal and evaluated this completely.  There  appeared to be a right lateral anal canal mass approximately 5 mm in size.  No other discrete lesions could be noted.  Grade 1 hemorrhoids in the left lateral and right posterior position.  Grade 2, small right anterior hemorrhoid.  At this point I placed a 2% acetic acid soaked sponge into the anal canal and allowed this to sit for approximately 2 minutes.  The microscope was then brought onto the field and the anal canal was evaluated completely.  The the right lateral anal canal lesion stained white.  There was also some acetowhite diffuse staining noted in the anterior midline within the distal rectal mucosa and proximal anal mucosa.  Representative biopsy was obtained.  This was closed with a 3-0 chromic suture.  The right lateral mass was excised completely with Metzenbaum scissors and closed with a interrupted 3-0 chromic suture.  Both of these were sent to pathology for further examination.  I then elevated the right anterior hemorrhoid away from the sphincter complex.  I then excised this using Metzenbaum scissors making sure to excise only the hemorrhoidal tissue and none of the underlying sphincter.  This was then removed and sent to pathology.  The distal rectal mucosa was closed using a running 2-0 chromic suture.  The anoderm distal to the dentate line was then closed using a running 3-0 chromic suture.  Hemostasis was good.  Additional Marcaine was placed around the incision sites.  A dressing was applied.  The patient was then awakened from anesthesia and sent to the postanesthesia care unit in stable condition.  All counts were correct per operating room staff.  Vanita Panda, MD  Colorectal and General Surgery [facility]

## 2022-10-01 NOTE — Anesthesia Postprocedure Evaluation (Signed)
Anesthesia Post Note  Patient: Kimberly Macdonald  Procedure(s) Performed: HIGH RESOLUTION ANOSCOPY WITH BIOPSY (Rectum) HEMORRHOIDECTOMY (Rectum)     Patient location during evaluation: PACU Anesthesia Type: MAC Level of consciousness: awake and alert Pain management: pain level controlled Vital Signs Assessment: post-procedure vital signs reviewed and stable Respiratory status: spontaneous breathing, nonlabored ventilation, respiratory function stable and patient connected to nasal cannula oxygen Cardiovascular status: stable and blood pressure returned to baseline Postop Assessment: no apparent nausea or vomiting Anesthetic complications: no  No notable events documented.  Last Vitals:  Vitals:   10/01/22 0935 10/01/22 0940  BP:    Pulse: 66 83  Resp: 11 15  Temp:    SpO2: 99% 100%    Last Pain:  Vitals:   10/01/22 0917  TempSrc:   PainSc: 0-No pain                 [name] S

## 2022-10-01 NOTE — Anesthesia Preprocedure Evaluation (Addendum)
Anesthesia Evaluation  Patient identified by MRN, date of birth, ID band Patient awake    Reviewed: Allergy & Precautions, H&P , NPO status , Patient's Chart, lab work & pertinent test results  Airway Mallampati: II  TM Distance: >3 FB Neck ROM: Full    Dental no notable dental hx.    Pulmonary neg pulmonary ROS   Pulmonary exam normal breath sounds clear to auscultation       Cardiovascular hypertension, Pt. on medications and Pt. on home beta blockers Normal cardiovascular exam Rhythm:Regular Rate:Normal     Neuro/Psych negative neurological ROS  negative psych ROS   GI/Hepatic Neg liver ROS,,,  Endo/Other  Hypothyroidism    Renal/GU negative Renal ROS  negative genitourinary   Musculoskeletal negative musculoskeletal ROS (+)    Abdominal   Peds negative pediatric ROS (+)  Hematology negative hematology ROS (+)   Anesthesia Other Findings   Reproductive/Obstetrics negative OB ROS                             Anesthesia Physical Anesthesia Plan  ASA: 2  Anesthesia Plan: General   Post-op Pain Management: Tylenol PO (pre-op)*   Induction: Intravenous  PONV Risk Score and Plan: 3 and Ondansetron, Dexamethasone, Droperidol and Treatment may vary due to age or medical condition  Airway Management Planned: Oral ETT  Additional Equipment:   Intra-op Plan:   Post-operative Plan: Extubation in OR  Informed Consent: I have reviewed the patients History and Physical, chart, labs and discussed the procedure including the risks, benefits and alternatives for the proposed anesthesia with the patient or authorized representative who has indicated his/her understanding and acceptance.     Dental advisory given  Plan Discussed with: CRNA and Surgeon  Anesthesia Plan Comments:        Anesthesia Quick Evaluation

## 2022-10-01 NOTE — Discharge Instructions (Addendum)
ANORECTAL SURGERY: POST OP INSTRUCTIONS Take your usually prescribed home medications unless otherwise directed. DIET: During the first few hours after surgery sip on some liquids until you are able to urinate.  It is normal to not urinate for several hours after this surgery.  If you feel uncomfortable, please contact the office for instructions.  After you are able to urinate,you may eat, if you feel like it.  Follow a light bland diet the first 24 hours after arrival home, such as soup, liquids, crackers, etc.  Be sure to include lots of fluids daily (6-8 glasses).  Avoid fast food or heavy meals, as your are more likely to get nauseated.  Eat a low fat diet the next few days after surgery.  Limit caffeine intake to 1-2 servings a day. PAIN CONTROL: Pain is best controlled by a usual combination of several different methods TOGETHER: Muscle relaxation: Soak in a warm bath (or Sitz bath) three times a day and after bowel movements.  Continue to do this until all pain is resolved. Over the counter pain medication Prescription pain medication Most patients will experience some swelling and discomfort in the anus/rectal area and incisions.  Heat such as warm towels, sitz baths, warm baths, etc to help relax tight/sore spots and speed recovery.  Some people prefer to use ice, especially in the first couple days after surgery, as it may decrease the pain and swelling, or alternate between ice & heat.  Experiment to what works for you.  Swelling and bruising can take several weeks to resolve.  Pain can take even longer to completely resolve. It is helpful to take an over-the-counter pain medication regularly for the first few weeks.  Choose one of the following that works best for you: Naproxen (Aleve, etc)  Two 220mg  tabs twice a day Ibuprofen (Advil, etc) Three 200mg  tabs four times a day (every meal & bedtime) A  prescription for pain medication (such as percocet, oxycodone, hydrocodone, etc) should be  given to you upon discharge.  Take your pain medication as prescribed.  If you are having problems/concerns with the prescription medicine (does not control pain, nausea, vomiting, rash, itching, etc), please call us [phone] to see if we need to switch you to a different pain medicine that will work better for you and/or control your side effect better. If you need a refill on your pain medication, please contact your pharmacy.  They will contact our office to request authorization. Prescriptions will not be filled after 5 pm or on week-ends. KEEP YOUR BOWELS REGULAR and AVOID CONSTIPATION The goal is one to two soft bowel movements a day.  You should at least have a bowel movement every other day. Avoid getting constipated.  Between the surgery and the pain medications, it is common to experience some constipation. This can be very painful after rectal surgery.  Increasing fluid intake and taking a fiber supplement (such as Metamucil, Citrucel, FiberCon, etc) 1-2 times a day regularly will usually help prevent this problem from occurring.  A stool softener like colace is also recommended.  This can be purchased over the counter at your pharmacy.  You can take it up to 3 times a day.  If you do not have a bowel movement after 24 hrs since your surgery, take one does of milk of magnesia.  If you still haven't had a bowel movement 8-12 hours after that dose, take another dose.  If you don't have a bowel movement 48 hrs after surgery,  purchase a Fleets enema from the drug store and administer gently per package instructions.  If you still are having trouble with your bowel movements after that, please call the office for further instructions. If you develop diarrhea or have many loose bowel movements, simplify your diet to bland foods & liquids for a few days.  Stop any stool softeners and decrease your fiber supplement.  Switching to mild anti-diarrheal medications (Kayopectate, Pepto Bismol) can help.   If this worsens or does not improve, please call us.  Wound Care Remove your bandages before your first bowel movement or 8 hours after surgery.     Remove any wound packing material at this tim,e as well.  You do not need to repack the wound unless instructed otherwise.  Wear an absorbent pad or soft cotton gauze in your underwear to catch any drainage and help keep the area clean. You should change this every 2-3 hours while awake. Keep the area clean and dry.  Bathe / shower every day, especially after bowel movements.  Keep the area clean by showering / bathing over the incision / wound.   It is okay to soak an open wound to help wash it.  Wet wipes or showers / gentle washing after bowel movements is often less traumatic than regular toilet paper. You may have some styrofoam-like soft packing in the rectum which will come out with the first bowel movement.  You will often notice bleeding with bowel movements.  This should slow down by the end of the first week of surgery Expect some drainage.  This should slow down, too, by the end of the first week of surgery.  Wear an absorbent pad or soft cotton gauze in your underwear until the drainage stops. Do Not sit on a rubber or pillow ring.  This can make you symptoms worse.  You may sit on a soft pillow if needed.  ACTIVITIES as tolerated:   You may resume regular (light) daily activities beginning the next day--such as daily self-care, walking, climbing stairs--gradually increasing activities as tolerated.  If you can walk 30 minutes without difficulty, it is safe to try more intense activity such as jogging, treadmill, bicycling, low-impact aerobics, swimming, etc. Save the most intensive and strenuous activity for last such as sit-ups, heavy lifting, contact sports, etc  Refrain from any heavy lifting or straining until you are off narcotics for pain control.   You may drive when you are no longer taking prescription pain medication, you can  comfortably sit for long periods of time, and you can safely maneuver your car and apply brakes. You may have sexual intercourse when it is comfortable.  FOLLOW UP in our office Please call [facility] at [phone] to set up an appointment to see your surgeon in the office for a follow-up appointment approximately 3-4 weeks after your surgery. Make sure that you call for this appointment the day you arrive home to insure a convenient appointment time. 10. IF YOU HAVE DISABILITY OR FAMILY LEAVE FORMS, BRING THEM TO THE OFFICE FOR PROCESSING.  DO NOT GIVE THEM TO YOUR DOCTOR.     WHEN TO CALL us [phone]: Poor pain control Reactions / problems with new medications (rash/itching, nausea, etc)  Fever over 101.5 F (38.5 C) Inability to urinate Nausea and/or vomiting Worsening swelling or bruising Continued bleeding from incision. Increased pain, redness, or drainage from the incision  The clinic staff is available to answer your questions during regular business hours (8:30am-5pm).  Please don't hesitate to call and ask to speak to one of our nurses for clinical concerns.   A surgeon from [facility] is always on call at the hospitals   If you have a medical emergency, go to the nearest emergency room or call 911.    [facility] [address], [address], [place], [place]  [address] ? MAIN: [phone] ? TOLL FREE: [phone] ? FAX [phone] [email or web address]   Post Anesthesia Home Care Instructions  Activity: Get plenty of rest for the remainder of the day. A responsible individual must stay with you for 24 hours following the procedure.  For the next 24 hours, DO NOT: -Drive a car -Advertising copywriter -Drink alcoholic beverages -Take any medication unless instructed by your physician -Make any legal decisions or sign important papers.  Meals: Start with liquid foods such as gelatin or soup. Progress to regular foods  as tolerated. Avoid greasy, spicy, heavy foods. If nausea and/or vomiting occur, drink only clear liquids until the nausea and/or vomiting subsides. Call your physician if vomiting continues.  Special Instructions/Symptoms: Your throat may feel dry or sore from the anesthesia or the breathing tube placed in your throat during surgery. If this causes discomfort, gargle with warm salt water. The discomfort should disappear within 24 hours.    Information for Discharge Teaching: EXPAREL (bupivacaine liposome injectable suspension)   Your surgeon or anesthesiologist gave you EXPAREL(bupivacaine) to help control your pain after surgery.  EXPAREL is a local anesthetic that provides pain relief by numbing the tissue around the surgical site. EXPAREL is designed to release pain medication over time and can control pain for up to 72 hours. Depending on how you respond to EXPAREL, you may require less pain medication during your recovery.  Possible side effects: Temporary loss of sensation or ability to move in the area where bupivacaine was injected. Nausea, vomiting, constipation Rarely, numbness and tingling in your mouth or lips, lightheadedness, or anxiety may occur. Call your doctor right away if you think you may be experiencing any of these sensations, or if you have other questions regarding possible side effects.  Follow all other discharge instructions given to you by your surgeon or nurse. Eat a healthy diet and drink plenty of water or other fluids.  If you return to the hospital for any reason within 96 hours following the administration of EXPAREL, it is important for health care providers to know that you have received this anesthetic. A teal colored band has been placed on your arm with the date, time and amount of EXPAREL you have received in order to alert and inform your health care providers. Please leave this armband in place for the full 96 hours following administration, and then  you may remove the band.  Do not remove green armband before Sunday, October 05, 2022.  May take Tylenol beginning at 1 PM today as needed for soreness/discomfort.

## 2022-10-02 ENCOUNTER — Encounter (HOSPITAL_BASED_OUTPATIENT_CLINIC_OR_DEPARTMENT_OTHER): Payer: Self-pay | Admitting: General Surgery

## 2022-10-02 LAB — SURGICAL PATHOLOGY

## 2023-11-06 NOTE — Discharge Summary (Signed)
 [facility]  Discharge Summary  PCP: Erminio LITTIE Sensing, MD Discharge Details   Admit date:         10/30/2023 Discharge date and time:       11/05/2023 Hospital LOS:    7  days  Active Hospital Problems   Diagnosis Date Noted POA  . Hypovolemia 11/01/2023 Yes  . Significant drop in hemoglobin 11/01/2023 No  . Epigastric pain 10/31/2023 Yes  . RLQ abdominal pain 10/31/2023 Yes  . Overweight with body mass index (BMI) of 26 to 26.9 in adult 10/31/2023 Not Applicable  . GERD (gastroesophageal reflux disease) 10/31/2023 Yes  . Lactic acidosis 10/31/2023 Yes  . Diarrhea of presumed infectious origin 10/31/2023 Yes  . Acute diverticulitis 10/19/2023 Yes  . Essential hypertension 11/05/2016 Yes  . Hypothyroidism 11/05/2016 Yes  . Depression 05/17/2011 Yes  . Sinus tachycardia 05/17/2011 Yes    Resolved Hospital Problems   Diagnosis Date Noted Date Resolved POA  . *Septic shock due to undetermined organism (*) 10/31/2023 11/05/2023 Yes      Current Discharge Medication List     START taking these medications      Details  ciprofloxacin 500 mg tablet Commonly known as: CIPRO  Take one tablet (500 mg dose) by mouth 2 (two) times daily for 3 days. Quantity: 6 tablet   ondansetron  4 mg tablet Commonly known as: ZOFRAN   Take one tablet (4 mg dose) by mouth every 8 (eight) hours as needed for Nausea for up to 7 days. Quantity: 20 tablet       CONTINUE these medications which have NOT CHANGED      Details  arginine 500 mg tablet  Take two tablets (1,000 mg dose) by mouth daily.   B-complex with vitamin C tablet  Take one tablet by mouth daily.   cranberry 500 mg Caps capsule  Take one capsule (500 mg dose) by mouth daily.   estradiol 0.5 mg tablet Commonly known as: ESTRACE  Take one tablet (0.5 mg dose) by mouth daily.   Ginseng 100 MG Caps  Take 100 mg by mouth daily.   GNP GINGKO BILOBA  EXTRACT PO  Take 40 mg by mouth daily.   ibuprofen 200 mg tablet Commonly known as: ADVIL,MOTRIN  Take three tablets (600 mg dose) by mouth every 6 (six) hours as needed for Pain.   levothyroxine  sodium 88 mcg tablet Commonly known as: SYNTHROID ,LEVOTHROID,LEVOXYL   Take one tablet (88 mcg dose) by mouth daily.   metoprolol  succinate 50 mg 24 hr tablet Commonly known as: TOPROL -XL  TAKE ONE TABLET (50 MG DOSE) BY MOUTH DAILY. DUE FOR APPOINTMENT Quantity: 30 tablet   multivitamin tablet  Take one tablet by mouth daily.   omeprazole 20 mg capsule Commonly known as: PRILOSEC  TAKE 1 CAPSULE(20 MG) BY MOUTH DAILY Quantity: 30 capsule   Probiotic (Lactobacillus) Caps  Take 1 capsule by mouth every morning.   progesterone 100 mg capsule Commonly known as: PROMETRIUM  Take one capsule (100 mg dose) by mouth at bedtime.   valACYclovir  500 mg tablet Commonly known as: VALTREX   TAKE 1 TABLET BY MOUTH TWICE A DAY AS NEEDED FOR OUTBREAKS   vitamin C 100 MG tablet  Take one tablet (100 mg dose) by mouth daily.   Vitamin D3 10 MCG (400 UNIT) tablet  Take one tablet (400 Units dose) by mouth daily.      * You might also be taking other medications not listed above.  If you have questions about any of your other medications, talk to the person who prescribed them or your Primary Care Provider.          STOP taking these medications    amoxicillin -clavulanate 875-125 mg per tablet Commonly known as: AUGMENTIN    HYDROcodone -acetaminophen  5-325 mg per tablet Commonly known as: [facility] Course   Indication for Admission/chief complaint: Abdominal pain  Hospital Course:        58 year old female, history of tachycardia, GERD, who was recently hospitalized under general surgery service for the sigmoid colon diverticulitis with perforation 6/16 through 6/24 that was managed conservatively and was eventually discharged home on Augmentin  by general surgery.   She returned to the ED for symptoms of worsening abdominal pain in the epigastric and right lower abdominal area, nausea, vomiting as well as diarrhea.  She was tachycardic in the ED.  A repeat CT scanning of her abdomen and pelvis with contrast showed moderate wall thickening of the descending and sigmoid colon, diverticulosis without diverticulitis, colitis.  Lactic acid was 3.3.  She was afebrile, slightly tachycardic.  WBC count was 27.4.  #1.  Colitis/sepsis/lactic acidosis.  Patient was admitted and treated with IV antibiotics and IV fluids.  She was maintained on IV Zosyn throughout her stay in the hospital.  She was seen by general surgery in consultation.  Recommendation was to continue with conservative management with antibiotics and IV fluids.  Patient overall remained hemodynamically stable, remained afebrile, leukocytosis corrected quickly.  Inflammatory markers improved.  She continues to have some symptoms of intermittent abdominal cramping, however has not had any further vomiting.  Has had some intermittent loose stools but these appear to be slowing down. General surgery is okay with patient being discharged home.  Will prescribe another 3 days of oral ciprofloxacin. Will need for outpatient follow-up with general surgery as well as referral to GI. If she has recurrent symptoms, may need to consider elective surgery as per general surgery team.  Recommendations to physicians/followup needed: Pcp, surgery, GI    Physical Exam: Vitals:   11/05/23 1634  BP: 132/86  Pulse: 82  Resp: 16  Temp: 97.9 F (36.6 C)  SpO2: 100%     Labs on Discharge:  Recent Labs    Units 11/05/23 0313 11/03/23 0142 11/02/23 0220 11/01/23 0334 10/30/23 2359  WBC thou/mcL 6.7 6.9 6.4 12.4* 27.4*  HGB gm/dL 87.1 88.4 88.6 89.6* 85.6  HCT % 37.9 34.3 33.9* 31.0* 40.9  PLT thou/mcL 358 290 252 190 325   Recent Labs    Units 11/05/23 0313 11/03/23 0142 11/02/23 0220 11/01/23 0334  10/31/23 1106 10/30/23 2359  NA mmol/L 142 141 141 141 142 140  K mmol/L 4.1 3.7 4.7 3.6* 3.4* 3.9  CL mmol/L 103 102 107 109* 103 101  CO2 mmol/L 28 28 26 25 21 23   BUN mg/dL 8 7 4* 5* 11 14  CREATININE mg/dL 9.40 9.30 9.42 9.44* 9.37 0.61  CALCIUM mg/dL 9.4 8.9 9.1 8.2* 8.6* 9.4  PHOS mg/dL  --  4.3 3.1 3.0 2.3*  --    Recent Labs    Units 11/03/23 0142 11/02/23 0220 11/01/23 0334 10/31/23 1106 10/30/23 2359  BILITOT mg/dL 0.3 0.4 0.4 0.5 0.3  AST U/L 12 17 17 23 25   ALT U/L 19 19 21 29  32  ALKPHOS U/L 92 78 68 94 109  ALBUMIN gm/dL 3.6 3.3* 3.0* 3.9 4.3   No results for input(s):  TSH, HGBA1C in the last 168 hours. No results for input(s): LABPROT, INR, PTT in the last 168 hours. No results for input(s): CHOL, LDL, HDL, TRIG in the last 168 hours. No results for input(s): TROPONIN, CK in the last 168 hours.  Invalid input(s): CK-MB   Post Hospital Care   Discharge Procedure Orders  Ambulatory referral to Gastroenterology  Referral Priority: Routine Referral Type: Consultation  Referral Reason: Specialty Services Required  Requested Specialty: Gastroenterology  Number of Visits Requested: 1 Expiration Date: 04/27/24   Ambulatory referral to ENT  Referral Priority: Routine Referral Type: Consultation  Referral Reason: Evaluate and Return  Requested Specialty: Otolaryngology  Number of Visits Requested: 1 Expiration Date: 05/02/24   Activity as tolerated   DEEP BREATHE AND COUGH AND DRINK A LOT OF FLUIDS every 2 hours today and tomorrow   Discharge instructions  Order Comments: Nurse to provide additional hospital or office specific instruction sheet (ensure copy is placed on patient chart and documented)  Call [phone] if symptoms worsen or if you have questions or concerns.   Soft diet with low fiber and low fat for one week.  Follow-up in clinic next week.    Diet: No diet orders on file  Potential for Rehab:         Fair Code Status:   Full Code Disposition: Home Consults: surgery  Followup appointments: No future appointments.   Time spent in discharge process:  total time spent 35 minutes   Electronically signed: Mohsin DELENA Haley, MD 11/06/2023 / 6:43 PM   *Some images could not be shown.

## 2024-01-01 ENCOUNTER — Ambulatory Visit: Payer: PRIVATE HEALTH INSURANCE | Admitting: Cardiology

## 2024-03-06 NOTE — Progress Notes (Deleted)
 Cardiology Office Note:    Date:  03/06/2024   ID:  Kimberly Macdonald, DOB October 11, 1965, MRN [id number]  PCP:  [name], MD  Cardiologist:  None  Electrophysiologist:  None   Referring MD: [name], MD   No chief complaint on file.   History of Present Illness:    Kimberly Macdonald is a 58 y.o. female with a hx of hypothyroidism who presents for follow-up.  She was referred by Dr. [name] for evaluation of tachycardia, initially seen 02/29/2020.  She previously followed with Dr. [name], last seen in 2014.  She underwent MET test on 02/23/2012 which showed normal effort (peak VO2 73%), markedly accelerated heart rate at 97% predicted and a heart rate on VO2 curve showed a steep incline at anaerobic threshold with some flattening of the VO2 curve consistent with probable small vessel ischemia.  She was started on metoprolol .  Reported transient vision loss in right eye  in 2021 that lasted 3 to 5 minutes 2 weeks ago.  Reports she has been seen by her ophthalmologist.  Carotid duplex was normal 03/2020.  Echocardiogram 03/2020 showed normal biventricular function, mild mitral regurgitation, mild to moderate aortic regurgitation.  Zio patch x 14 days 03/2020 showed a brief episode of junctional rhythm, otherwise no significant arrhythmias.  Echocardiogram 03/2021 showed normal biventricular function, mild aortic regurgitation.  Since last clinic visit,    Will check carotid duplex, echocardiogram, and Zio patch x2 weeks.  Check ESR/CRP.  Referred to neurology for further evaluation.  Reports that she continues to take metoprolol  and palpitations are under control while on the medication.  She denies any chest pain or dyspnea.  Reports she exercises by boxing regularly, lifting weights, and doing the elliptical.  Does report intermittent lightheadedness with standing.  Denies any syncope.  Reports 2 weeks ago had transient vision loss in her right eye.  Vision returned in 3  to 5 minutes.    Past Medical History:  Diagnosis Date   Acne    Anal intraepithelial neoplasia I    Cataract    right eye, getting a second opinion   Cervical dysplasia    Depression    Diverticulosis    GERD (gastroesophageal reflux disease)    with pregnancy   Heart palpitations    History of   History of colon polyps    History of iron deficiency anemia    as teenager   History of rectal bleeding    History of shingles    History of vertigo    Hypothyroidism    Internal hemorrhoids    Interstitial cystitis    resolved   Low blood pressure    PONV (postoperative nausea and vomiting)    Pt cannot remember hx of ponv   Poor circulation of extremity    bilateral lower   Sinus tachycardia    History of   Tachycardia    Thyroid  disease    [employer]reesia 05/06/2020   Upper extremity pain    Left lower arm/hand, broken finger healing wrong per pt, and elbow pain; 6 months of pain as of 10/01/22, has not seen MD   Wears glasses     Past Surgical History:  Procedure Laterality Date   APPENDECTOMY  1994   BREAST ENHANCEMENT SURGERY     Cardiometabolic Testing  02/2012   normal effort with peak VO2 of 73% predicted, HR accelerated at 97% predcited, HR on VO2 curve showed steep invline at anaerobic threshold - low risk, abnormal  CERVICAL CONIZATION W/BX     CHOLECYSTECTOMY     CYSTOSCOPY W/ DILATION OF BLADDER     DILATION AND EVACUATION     miscarriage x3   GYNECOLOGIC CRYOSURGERY     HEMORRHOID SURGERY N/A 10/01/2022   Procedure: HEMORRHOIDECTOMY;  Surgeon: Debby Hila, MD;  Location: [facility];  Service: General;  Laterality: N/A;   HIGH RESOLUTION ANOSCOPY N/A 05/12/2019   Procedure: HIGH RESOLUTION ANOSCOPY, ANAL BIOPSY;  Surgeon: Debby Hila, MD;  Location: [facility];  Service: General;  Laterality: N/A;   HIGH RESOLUTION ANOSCOPY N/A 10/01/2022   Procedure: HIGH RESOLUTION ANOSCOPY WITH BIOPSY;  Surgeon: Debby Hila, MD;   Location: [facility];  Service: General;  Laterality: N/A;   SHOULDER SURGERY Left    Muscle detachment   TONSILLECTOMY     TRANSTHORACIC ECHOCARDIOGRAM  03/2005   EF 55-65%, normal LV systolic function - no LV regional wall motion abnormalities   VAGINAL WOUND CLOSURE / REPAIR     after vaginal delivery   WISDOM TOOTH EXTRACTION      Current Medications: No outpatient medications have been marked as taking for the 03/09/24 encounter (Appointment) with Kimberly Lonni CROME, MD.     Allergies:   Azo [phenazopyridine hcl], Flagyl [metronidazole], Other, Iodine, Nitrofurantoin monohyd macro, Lodine [etodolac], and Macrolides and ketolides   Social History   Socioeconomic History   Marital status: Married    Spouse name: Not on file   Number of children: 3   Years of education: 54yr coll.   Highest education level: Not on file  Occupational History   Occupation: singer/actor  Tobacco Use   Smoking status: Never   Smokeless tobacco: Never  Vaping Use   Vaping status: Never Used  Substance and Sexual Activity   Alcohol use: Yes    Comment: rare (twice yearly)   Drug use: No   Sexual activity: Not Currently    Partners: Male    Birth control/protection: Post-menopausal  Other Topics Concern   Not on file  Social History Narrative   Right handed   Social Drivers of Health   Financial Resource Strain: Low Risk  (11/12/2023)   Received from [facility]   Overall Financial Resource Strain (CARDIA)    Difficulty of Paying Living Expenses: Not very hard  Food Insecurity: No Food Insecurity (11/12/2023)   Received from [facility]   Hunger Vital Sign    Within the past 12 months, you worried that your food would run out before you got the money to buy more.: Never true    Within the past 12 months, the food you bought just didn't last and you didn't have money to get more.: Never true  Transportation Needs: Patient Declined (11/12/2023)   Received from  [facility] - Transportation    Lack of Transportation (Medical): Patient declined    Lack of Transportation (Non-Medical): Patient declined  Physical Activity: Patient Declined (11/12/2023)   Received from [facility]   Exercise Vital Sign    On average, how many days per week do you engage in moderate to strenuous exercise (like a brisk walk)?: Patient declined    On average, how many minutes do you engage in exercise at this level?: Patient declined  Stress: Stress Concern Present (11/12/2023)   Received from [facility] - Occupational Stress Questionnaire    Feeling of Stress : Rather much  Social Connections: Somewhat Isolated (11/12/2023)  Received from [facility]   Social Network    How would you rate your social network (family, work, friends)?: Restricted participation with some degree of social isolation     Family History: The patient's family history includes Breast cancer in her maternal grandmother; Colon polyps in her mother; Depression in her sister; Diverticulitis in her mother; Heart disease in her paternal grandfather and paternal grandmother; Hyperlipidemia in her father; Hypertension in her father and mother. There is no history of Colon cancer, Esophageal cancer, or Rectal cancer.  ROS:   Please see the history of present illness.     All other systems reviewed and are negative.  EKGs/Labs/Other Studies Reviewed:    The following studies were reviewed today:   EKG:  EKG is ordered today.  The ekg ordered today demonstrates normal sinus rhythm, rate 81, no ST/T abnormalities  Recent Labs: No results found for requested labs within last 365 days.  Recent Lipid Panel    Component Value Date/Time   CHOL 180 02/29/2020 1550   TRIG 163 (H) 02/29/2020 1550   HDL 72 02/29/2020 1550   CHOLHDL 2.5 02/29/2020 1550   CHOLHDL 3.1 06/09/2011 1610   VLDL 40 06/09/2011 1610   LDLCALC 81 02/29/2020 1550     Physical Exam:    VS:  LMP 06/21/2015 (Approximate)     Wt Readings from Last 3 Encounters:  10/01/22 137 lb 8 oz (62.4 kg)  10/01/21 130 lb 3.2 oz (59.1 kg)  05/09/20 135 lb 6.4 oz (61.4 kg)     GEN:  Well nourished, well developed in no acute distress HEENT: Normal NECK: No JVD; No carotid bruits LYMPHATICS: No lymphadenopathy CARDIAC: RRR, no murmurs, rubs, gallops RESPIRATORY:  Clear to auscultation without rales, wheezing or rhonchi  ABDOMEN: Soft, non-tender, non-distended MUSCULOSKELETAL:  No edema; No deformity  SKIN: Warm and dry NEUROLOGIC:  Alert and oriented x 3 PSYCHIATRIC:  Normal affect   ASSESSMENT:    No diagnosis found.  PLAN:     Amaurosis fugax: Reported transient vision loss in right eye that lasted 3 to 5 minutes in 2021.  Reports she has been seen by her ophthalmologist.  Carotid duplex was normal 03/2020.  Echocardiogram 03/2020 showed normal biventricular function, mild mitral regurgitation, mild to moderate aortic regurgitation.  Zio patch x 14 days 03/2020 showed a brief episode of junctional rhythm, otherwise no significant arrhythmias.  She was referred to neurology, workup unremarkable  Aortic regurgitation: Mild to moderate on echocardiogram 03/2020.  Echocardiogram 03/2021 showed normal biventricular function, mild aortic regurgitation.  Tachycardia/palpitations.  Appears controlled on metoprolol , will continue  Lipid screening: No recent lipid panel, will check  RTC in 3 months***  Medication Adjustments/Labs and Tests Ordered: Current medicines are reviewed at length with the patient today.  Concerns regarding medicines are outlined above.  No orders of the defined types were placed in this encounter.  No orders of the defined types were placed in this encounter.   There are no Patient Instructions on file for this visit.   Signed, Lonni LITTIE Nanas, MD  03/06/2024 5:25 PM    [facility]

## 2024-03-09 ENCOUNTER — Telehealth: Payer: Self-pay | Admitting: Cardiology

## 2024-03-09 ENCOUNTER — Ambulatory Visit: Payer: PRIVATE HEALTH INSURANCE | Admitting: Cardiology

## 2024-03-09 NOTE — Telephone Encounter (Signed)
 Called and left brief VM for patient to call me back.MS

## 2024-03-17 NOTE — Telephone Encounter (Signed)
 Called and left brief VM for patient to call me back.MS
